# Patient Record
Sex: Female | Born: 1965 | Race: Black or African American | Hispanic: Yes | Smoking: Never smoker
Health system: Southern US, Community
[De-identification: ages and names within clinical notes are randomized; demographics above are authoritative.]

## PROBLEM LIST (undated history)

## (undated) DIAGNOSIS — E669 Obesity, unspecified: Secondary | ICD-10-CM

## (undated) HISTORY — DX: Obesity, unspecified: E66.9

## (undated) HISTORY — PX: TUBAL LIGATION: SHX77

## (undated) HISTORY — PX: LIPOSUCTION: SHX10

## (undated) HISTORY — PX: ABLATION: SHX5711

## (undated) HISTORY — PX: BUNIONECTOMY: SHX129

---

## 2014-03-11 ENCOUNTER — Encounter (HOSPITAL_BASED_OUTPATIENT_CLINIC_OR_DEPARTMENT_OTHER): Payer: Self-pay | Admitting: Emergency Medicine

## 2014-03-11 ENCOUNTER — Emergency Department (HOSPITAL_BASED_OUTPATIENT_CLINIC_OR_DEPARTMENT_OTHER): Payer: BC Managed Care – PPO

## 2014-03-11 ENCOUNTER — Emergency Department (HOSPITAL_BASED_OUTPATIENT_CLINIC_OR_DEPARTMENT_OTHER)
Admission: EM | Admit: 2014-03-11 | Discharge: 2014-03-11 | Disposition: A | Discharge status: Home / Self Care | Payer: BC Managed Care – PPO | Attending: Emergency Medicine | Admitting: Emergency Medicine

## 2014-03-11 DIAGNOSIS — R05 Cough: Secondary | ICD-10-CM

## 2014-03-11 DIAGNOSIS — R059 Cough, unspecified: Secondary | ICD-10-CM

## 2014-03-11 DIAGNOSIS — J9801 Acute bronchospasm: Secondary | ICD-10-CM | POA: Diagnosis not present

## 2014-03-11 MED ORDER — ALBUTEROL SULFATE HFA 108 (90 BASE) MCG/ACT IN AERS
2.0000 | INHALATION_SPRAY | Freq: Once | RESPIRATORY_TRACT | Status: AC
  Administered 2014-03-11: 2 via RESPIRATORY_TRACT
  Filled 2014-03-11: qty 6.7

## 2014-03-11 MED ORDER — BENZONATATE 100 MG PO CAPS: 100.0000 mg | ORAL_CAPSULE | Freq: Three times a day (TID) | ORAL | Status: DC

## 2014-03-11 MED ORDER — ALBUTEROL SULFATE (2.5 MG/3ML) 0.083% IN NEBU
5.0000 mg | INHALATION_SOLUTION | Freq: Once | RESPIRATORY_TRACT | Status: AC
  Administered 2014-03-11: 5 mg via RESPIRATORY_TRACT
  Filled 2014-03-11: qty 6

## 2014-03-11 NOTE — ED Notes (Signed)
C/o prod cough x 1.5 weeks

## 2014-03-11 NOTE — Discharge Instructions (Signed)
Use albuterol inhaler every 4-6 hours as needed for cough and wheezing. Take Tessalon as directed as needed for cough.  Bronchospasm A bronchospasm is a spasm or tightening of the airways going into the lungs. During a bronchospasm breathing becomes more difficult because the airways get smaller. When this happens there can be coughing, a whistling sound when breathing (wheezing), and difficulty breathing. Bronchospasm is often associated with asthma, but not all patients who experience a bronchospasm have asthma. CAUSES  A bronchospasm is caused by inflammation or irritation of the airways. The inflammation or irritation may be triggered by:   Allergies (such as to animals, pollen, food, or mold). Allergens that cause bronchospasm may cause wheezing immediately after exposure or many hours later.   Infection. Viral infections are believed to be the most common cause of bronchospasm.   Exercise.   Irritants (such as pollution, cigarette smoke, strong odors, aerosol sprays, and paint fumes).   Weather changes. Winds increase molds and pollens in the air. Rain refreshes the air by washing irritants out. Cold air may cause inflammation.   Stress and emotional upset.  SIGNS AND SYMPTOMS   Wheezing.   Excessive nighttime coughing.   Frequent or severe coughing with a simple cold.   Chest tightness.   Shortness of breath.  DIAGNOSIS  Bronchospasm is usually diagnosed through a history and physical exam. Tests, such as chest X-rays, are sometimes done to look for other conditions. TREATMENT   Inhaled medicines can be given to open up your airways and help you breathe. The medicines can be given using either an inhaler or a nebulizer machine.  Corticosteroid medicines may be given for severe bronchospasm, usually when it is associated with asthma. HOME CARE INSTRUCTIONS   Always have a plan prepared for seeking medical care. Know when to call your health care provider and  local emergency services (911 in the [place] Know where you can access local emergency care.  Only take medicines as directed by your health care provider.  If you were prescribed an inhaler or nebulizer machine, ask your health care provider to explain how to use it correctly. Always use a spacer with your inhaler if you were given one.  It is necessary to remain calm during an attack. Try to relax and breathe more slowly.  Control your home environment in the following ways:   Change your heating and air conditioning filter at least once a month.   Limit your use of fireplaces and wood stoves.  Do not smoke and do not allow smoking in your home.   Avoid exposure to perfumes and fragrances.   Get rid of pests (such as roaches and mice) and their droppings.   Throw away plants if you see mold on them.   Keep your house clean and dust free.   Replace carpet with wood, tile, or vinyl flooring. Carpet can trap dander and dust.   Use allergy-proof pillows, mattress covers, and box spring covers.   Wash bed sheets and blankets every week in hot water and dry them in a dryer.   Use blankets that are made of polyester or cotton.   Wash hands frequently. SEEK MEDICAL CARE IF:   You have muscle aches.   You have chest pain.   The sputum changes from clear or white to yellow, green, gray, or bloody.   The sputum you cough up gets thicker.   There are problems that may be related to the medicine you are given, such as a  rash, itching, swelling, or trouble breathing.  SEEK IMMEDIATE MEDICAL CARE IF:   You have worsening wheezing and coughing even after taking your prescribed medicines.   You have increased difficulty breathing.   You develop severe chest pain. MAKE SURE YOU:   Understand these instructions.  Will watch your condition.  Will get help right away if you are not doing well or get worse. Document Released: 05/04/2003 Document Revised:  05/06/2013 Document Reviewed: 10/21/2012 [facility] Patient Information 2015 [employer], [place]. This information is not intended to replace advice given to you by your health care provider. Make sure you discuss any questions you have with your health care provider.  Cough, Adult  A cough is a reflex that helps clear your throat and airways. It can help heal the body or may be a reaction to an irritated airway. A cough may only last 2 or 3 weeks (acute) or may last more than 8 weeks (chronic).  CAUSES Acute cough:  Viral or bacterial infections. Chronic cough:  Infections.  Allergies.  Asthma.  Post-nasal drip.  Smoking.  Heartburn or acid reflux.  Some medicines.  Chronic lung problems (COPD).  Cancer. SYMPTOMS   Cough.  Fever.  Chest pain.  Increased breathing rate.  High-pitched whistling sound when breathing (wheezing).  Colored mucus that you cough up (sputum). TREATMENT   A bacterial cough may be treated with antibiotic medicine.  A viral cough must run its course and will not respond to antibiotics.  Your caregiver may recommend other treatments if you have a chronic cough. HOME CARE INSTRUCTIONS   Only take over-the-counter or prescription medicines for pain, discomfort, or fever as directed by your caregiver. Use cough suppressants only as directed by your caregiver.  Use a cold steam vaporizer or humidifier in your bedroom or home to help loosen secretions.  Sleep in a semi-upright position if your cough is worse at night.  Rest as needed.  Stop smoking if you smoke. SEEK IMMEDIATE MEDICAL CARE IF:   You have pus in your sputum.  Your cough starts to worsen.  You cannot control your cough with suppressants and are losing sleep.  You begin coughing up blood.  You have difficulty breathing.  You develop pain which is getting worse or is uncontrolled with medicine.  You have a fever. MAKE SURE YOU:   Understand these instructions.  Will  watch your condition.  Will get help right away if you are not doing well or get worse. Document Released: 10/28/2010 Document Revised: 07/24/2011 Document Reviewed: 10/28/2010 [facility] Patient Information 2015 [place], [place]. This information is not intended to replace advice given to you by your health care provider. Make sure you discuss any questions you have with your health care provider.

## 2014-03-11 NOTE — ED Provider Notes (Signed)
CSN: [id number]     Arrival date & time 03/11/14  1819 History   First MD Initiated Contact with Patient 03/11/14 1826     Chief Complaint  Patient presents with  . Cough     (Consider location/radiation/quality/duration/timing/severity/associated sxs/prior Treatment) HPI Comments: This is a 48 year old female with no significant past medical history who presents to the emergency department complaining of a productive cough with yellow mucus 1-1/2 weeks. Patient endorses occasional shortness of breath with the cough. Denies chest pain, fever or chills. States she has been congested. She has been taking over-the-counter cough medication, Mucinex decongestant and NyQuil with minimal relief.  Patient is a 48 y.o. female presenting with cough. The history is provided by the patient.  Cough   History reviewed. No pertinent past medical history. Past Surgical History  Procedure Laterality Date  . Tubal ligation    . Cesarean section    . Bunionectomy     No family history on file. History  Substance Use Topics  . Smoking status: Never Smoker   . Smokeless tobacco: Not on file  . Alcohol Use: Yes   OB History   Grav Para Term Preterm Abortions TAB SAB Ect Mult Living                 Review of Systems  Respiratory: Positive for cough.    10 Systems reviewed and are negative for acute change except as noted in the HPI.   Allergies  Shellfish allergy  Home Medications   Prior to Admission medications   Medication Sig Start Date End Date Taking? Authorizing Provider  benzonatate (TESSALON) 100 MG capsule Take 1 capsule (100 mg total) by mouth every 8 (eight) hours. 03/11/14   Apolonio Cutting M Delainy Mcelhiney, PA-C   BP 151/91  Pulse 84  Temp(Src) 98.2 F (36.8 C) (Oral)  Resp 16  Ht 5\' 3"  (1.6 m)  Wt 220 lb (99.791 kg)  BMI 38.98 kg/m2  SpO2 99%  LMP 02/16/2014 Physical Exam  Nursing note and vitals reviewed. Constitutional: She is oriented to person, place, and time. She appears  well-developed and well-nourished. No distress.  HENT:  Head: Normocephalic and atraumatic.  Nasal congestion, mucosal edema. Post nasal drip.  Eyes: Conjunctivae and EOM are normal.  Neck: Normal range of motion. Neck supple.  Cardiovascular: Normal rate, regular rhythm and normal heart sounds.   Pulmonary/Chest: Effort normal and breath sounds normal.  Few scattered expiratory wheezes.  Musculoskeletal: Normal range of motion. She exhibits no edema.  Neurological: She is alert and oriented to person, place, and time. No sensory deficit.  Skin: Skin is warm and dry.  Psychiatric: She has a normal mood and affect. Her behavior is normal.    ED Course  Procedures (including critical care time) Labs Review Labs Reviewed - No data to display  Imaging Review Dg Chest 2 View  03/11/2014   CLINICAL DATA:  Shortness of breath and cough.  EXAM: CHEST  2 VIEW  COMPARISON:  None.  FINDINGS: The heart size and mediastinal contours are within normal limits. Both lungs are clear. The visualized skeletal structures are unremarkable.  IMPRESSION: No active cardiopulmonary disease.   Electronically Signed   By: Signa Kellaylor  Stroud M.D.   On: 03/11/2014 18:51     EKG Interpretation None      MDM   Final diagnoses:  Cough  Bronchospasm   Patient was unable to cough for a week and a half. She is nontoxic appearing and in no apparent distress. Afebrile,  vital signs stable. O2 sat 99% on room air. Chest x-ray obtained given productive cough x 1.5 weeks, no acute findings. Scattered expiratory wheezes on exam. After receiving nebulizer treatment, significant improvement of breath sounds. Patient reports she is feeling better. Will discharge patient home with albuterol inhaler and Tessalon for cough. Resources given for PCP follow-up. Stable for discharge. Return precautions given. Patient states understanding of treatment care plan and is agreeable.   Kathrynn SpeedRobyn M Sendy Pluta, PA-C 03/11/14 1915  Kathrynn Speedobyn M Tinisha Etzkorn,  PA-C 03/11/14 1919

## 2014-03-12 NOTE — ED Provider Notes (Signed)
Medical screening examination/treatment/procedure(s) were performed by non-physician practitioner and as supervising physician I was immediately available for consultation/collaboration.   EKG Interpretation None        Trey Esteban Kobashigawa, MD 03/12/14 0016 

## 2015-07-05 ENCOUNTER — Encounter (HOSPITAL_BASED_OUTPATIENT_CLINIC_OR_DEPARTMENT_OTHER): Payer: Self-pay

## 2015-07-05 ENCOUNTER — Emergency Department (HOSPITAL_BASED_OUTPATIENT_CLINIC_OR_DEPARTMENT_OTHER)
Admission: EM | Admit: 2015-07-05 | Discharge: 2015-07-05 | Disposition: A | Discharge status: Home / Self Care | Payer: BLUE CROSS/BLUE SHIELD | Attending: Emergency Medicine | Admitting: Emergency Medicine

## 2015-07-05 DIAGNOSIS — R51 Headache: Secondary | ICD-10-CM | POA: Diagnosis present

## 2015-07-05 DIAGNOSIS — J111 Influenza due to unidentified influenza virus with other respiratory manifestations: Secondary | ICD-10-CM | POA: Diagnosis not present

## 2015-07-05 DIAGNOSIS — R634 Abnormal weight loss: Secondary | ICD-10-CM | POA: Insufficient documentation

## 2015-07-05 DIAGNOSIS — R69 Illness, unspecified: Secondary | ICD-10-CM

## 2015-07-05 MED ORDER — ACETAMINOPHEN 500 MG PO TABS: 1000.0000 mg | ORAL_TABLET | Freq: Four times a day (QID) | ORAL | Status: DC | PRN

## 2015-07-05 MED ORDER — KETOROLAC TROMETHAMINE 60 MG/2ML IM SOLN
60.0000 mg | Freq: Once | INTRAMUSCULAR | Status: AC
  Administered 2015-07-05: 60 mg via INTRAMUSCULAR
  Filled 2015-07-05: qty 2

## 2015-07-05 MED ORDER — ACETAMINOPHEN 500 MG PO TABS
1000.0000 mg | ORAL_TABLET | Freq: Once | ORAL | Status: AC
  Administered 2015-07-05: 1000 mg via ORAL
  Filled 2015-07-05: qty 2

## 2015-07-05 MED ORDER — IBUPROFEN 800 MG PO TABS: 800.0000 mg | ORAL_TABLET | Freq: Three times a day (TID) | ORAL | Status: DC

## 2015-07-05 MED FILL — IBUPROFEN 800 MG TABLET: 800 | 7 days supply | Qty: 21 | Fill #0

## 2015-07-05 MED FILL — MAPAP 500 MG CAPLET: 500 | 12 days supply | Qty: 100 | Fill #0

## 2015-07-05 NOTE — ED Provider Notes (Signed)
CSN: [id number]     Arrival date & time 07/05/15  1058 History   First MD Initiated Contact with Patient 07/05/15 1312     Chief Complaint  Patient presents with  . Headache     (Consider location/radiation/quality/duration/timing/severity/associated sxs/prior Treatment) HPI Patient reports that she was seen by her weight loss clinic Toma Copier) on Saturday. She was started on Topamax as part of her weight loss regimen which is supposed to be synergistic with phentermine. The Topamax was not started for headaches or other purpose. She denies history of migraines. The patient poor she took her first dose on Sunday. She reports right after she took it she started to feel badly. She started getting some scratchy throat and cough. He checked the symptoms side effect profile and noted that cold symptoms were a listed side effect. She reports she went on to get body aches and headache. She reports her headache is generalized and she hurts in her eye sockets. She denies photophobia. No vomiting. She has had some cough but no chest pain or shortness of breath. She reports low-grade fever was just identified in the emergency department. History reviewed. No pertinent past medical history. Past Surgical History  Procedure Laterality Date  . Tubal ligation    . Cesarean section    . Bunionectomy     No family history on file. Social History  Substance Use Topics  . Smoking status: Never Smoker   . Smokeless tobacco: None  . Alcohol Use: No   OB History    No data available     Review of Systems 10 Systems reviewed and are negative for acute change except as noted in the HPI.    Allergies  Shellfish allergy  Home Medications   Prior to Admission medications   Medication Sig Start Date End Date Taking? Authorizing Provider  PHENTERMINE HCL PO Take by mouth.   Yes Historical Provider, MD  acetaminophen (TYLENOL) 500 MG tablet Take 2 tablets (1,000 mg total) by mouth every 6 (six) hours as  needed. 07/05/15   Arby Barrette, MD  ibuprofen (ADVIL,MOTRIN) 800 MG tablet Take 1 tablet (800 mg total) by mouth 3 (three) times daily. 07/05/15   Arby Barrette, MD   BP 117/85 mmHg  Pulse 108  Temp(Src) 100.2 F (37.9 C) (Oral)  Resp 16  Ht  (1.6 m)  Wt 184 lb (83.462 kg)  BMI 32.60 kg/m2  SpO2 98%  LMP  (LMP Unknown) Physical Exam  Constitutional: She is oriented to person, place, and time. She appears well-developed and well-nourished.  Patient appears uncomfortable but is alert and appropriate. She is nontoxic. No respiratory distress. Normal mental status.  HENT:  Head: Normocephalic and atraumatic.  Right Ear: External ear normal.  Left Ear: External ear normal.  Nose: Nose normal.  Mouth/Throat: Oropharynx is clear and moist.  Bilateral TMs no erythema or bulging.  Eyes: EOM are normal. Pupils are equal, round, and reactive to light.  Neck: Neck supple.  No meningismus.  Cardiovascular: Normal rate, regular rhythm, normal heart sounds and intact distal pulses.   Pulmonary/Chest: Effort normal and breath sounds normal.  Abdominal: Soft. Bowel sounds are normal. She exhibits no distension. There is no tenderness.  Musculoskeletal: Normal range of motion. She exhibits no edema.  Neurological: She is alert and oriented to person, place, and time. She has normal strength. No cranial nerve deficit. She exhibits normal muscle tone. Coordination normal. GCS eye subscore is 4. GCS verbal subscore is 5. GCS motor subscore  is 6.  Skin: Skin is warm, dry and intact.  Psychiatric: She has a normal mood and affect.    ED Course  Procedures (including critical care time) Labs Review Labs Reviewed - No data to display  Imaging Review No results found. I have personally reviewed and evaluated these images and lab results as part of my medical decision-making.   EKG Interpretation None      MDM   Final diagnoses:  Influenza-like illness   Patient feels strongly that  her symptoms are the side effect of Topamax. At this point, I have high suspicion of influenza as the etiology of symptoms. Patient was counseled that I felt this was the most likely diagnosis. She is counseled on the use of ibuprofen and Tylenol for headache and body aches with fever. Patient is alert and nontoxic. She has no respiratory distress. At this time feel she is safe for outpatient treatment for influenza-like illness.    Arby Barrette, MD 07/05/15 1345

## 2015-07-05 NOTE — ED Notes (Signed)
C/o HA, burning to eyes, "feel like i have a cold but i don't"-"scratchy throat"- s/s started sunday after taking first does of new med topamax-t NAD-steady gait

## 2015-07-05 NOTE — Discharge Instructions (Signed)
Suspected Influenza, Adult °Influenza ("the flu") is a viral infection of the respiratory tract. It occurs more often in winter months because people spend more time in close contact with one another. Influenza can make you feel very sick. Influenza easily spreads from person to person (contagious). °CAUSES  °Influenza is caused by a virus that infects the respiratory tract. You can catch the virus by breathing in droplets from an infected person's cough or sneeze. You can also catch the virus by touching something that was recently contaminated with the virus and then touching your mouth, nose, or eyes. °RISKS AND COMPLICATIONS °You may be at risk for a more severe case of influenza if you smoke cigarettes, have diabetes, have chronic heart disease (such as heart failure) or lung disease (such as asthma), or if you have a weakened immune system. Elderly people and pregnant women are also at risk for more serious infections. The most common problem of influenza is a lung infection (pneumonia). Sometimes, this problem can require emergency medical care and may be life threatening. °SIGNS AND SYMPTOMS  °Symptoms typically last 4 to 10 days and may include: °· Fever. °· Chills. °· Headache, body aches, and muscle aches. °· Sore throat. °· Chest discomfort and cough. °· Poor appetite. °· Weakness or feeling tired. °· Dizziness. °· Nausea or vomiting. °DIAGNOSIS  °Diagnosis of influenza is often made based on your history and a physical exam. A nose or throat swab test can be done to confirm the diagnosis. °TREATMENT  °In mild cases, influenza goes away on its own. Treatment is directed at relieving symptoms. For more severe cases, your health care provider may prescribe antiviral medicines to shorten the sickness. Antibiotic medicines are not effective because the infection is caused by a virus, not by bacteria. °HOME CARE INSTRUCTIONS °· Take medicines only as directed by your health care provider. °· Use a cool mist  humidifier to make breathing easier. °· Get plenty of rest until your temperature returns to normal. This usually takes 3 to 4 days. °· Drink enough fluid to keep your urine clear or pale yellow. °· Cover your mouth and nose when coughing or sneezing, and wash your hands well to prevent the virus from spreading. °· Stay home from work or school until the fever is gone for at least 1 full day. °PREVENTION  °An annual influenza vaccination (flu shot) is the best way to avoid getting influenza. An annual flu shot is now routinely recommended for all adults in the [place] °SEEK MEDICAL CARE IF: °· You experience chest pain, your cough worsens, or you produce more mucus. °· You have nausea, vomiting, or diarrhea. °· Your fever returns or gets worse. °SEEK IMMEDIATE MEDICAL CARE IF: °· You have trouble breathing, you become short of breath, or your skin or nails become bluish. °· You have severe pain or stiffness in the neck. °· You develop a sudden headache, or pain in the face or ear. °· You have nausea or vomiting that you cannot control. °MAKE SURE YOU:  °· Understand these instructions. °· Will watch your condition. °· Will get help right away if you are not doing well or get worse. °  °This information is not intended to replace advice given to you by your health care provider. Make sure you discuss any questions you have with your health care provider. °  °Document Released: 04/28/2000 Document Revised: 05/22/2014 Document Reviewed: 07/31/2011 °Elsevier Interactive Patient Education ©2016 [employer] Inc. ° °

## 2021-02-06 ENCOUNTER — Encounter (HOSPITAL_BASED_OUTPATIENT_CLINIC_OR_DEPARTMENT_OTHER): Payer: Self-pay | Admitting: Emergency Medicine

## 2021-02-06 ENCOUNTER — Emergency Department (HOSPITAL_BASED_OUTPATIENT_CLINIC_OR_DEPARTMENT_OTHER)
Admission: EM | Admit: 2021-02-06 | Discharge: 2021-02-06 | Disposition: A | Discharge status: Home / Self Care | Payer: Commercial Managed Care - PPO | Attending: Emergency Medicine | Admitting: Emergency Medicine

## 2021-02-06 ENCOUNTER — Other Ambulatory Visit: Payer: Self-pay

## 2021-02-06 DIAGNOSIS — H00012 Hordeolum externum right lower eyelid: Secondary | ICD-10-CM

## 2021-02-06 DIAGNOSIS — H02842 Edema of right lower eyelid: Secondary | ICD-10-CM | POA: Diagnosis present

## 2021-02-06 DIAGNOSIS — Z91040 Latex allergy status: Secondary | ICD-10-CM | POA: Diagnosis not present

## 2021-02-06 NOTE — ED Notes (Signed)
ED Provider at bedside. 

## 2021-02-06 NOTE — Discharge Instructions (Addendum)
If your stye does not improve in 1-2 weeks, call the number above for evaluation by ophthalmology.

## 2021-02-06 NOTE — ED Triage Notes (Addendum)
States has been having swelling and soreness to right lower eyelid , swelling noted to tear duct area of  OD, denies visual changes

## 2021-02-06 NOTE — ED Provider Notes (Signed)
[facility] EMERGENCY DEPARTMENT Provider Note   CSN: [id number] Arrival date & time: 02/06/21  0820     History Chief Complaint  Patient presents with   Eye Problem    Rita Hall is a 55 y.o. female.  HPI     55yo female presents with concern for swelling and soreness to right lower eyelid.  Began days ago.  NO visual changes, no congestion, no fever.  Had some discharge from eye today. No hx of similar.   History reviewed. No pertinent past medical history.  There are no problems to display for this patient.   Past Surgical History:  Procedure Laterality Date   ABLATION N/A    Uterine   BUNIONECTOMY     CESAREAN SECTION     LIPOSUCTION N/A    TUBAL LIGATION       OB History   No obstetric history on file.     No family history on file.  Social History   Tobacco Use   Smoking status: Never   Smokeless tobacco: Never  Substance Use Topics   Alcohol use: No   Drug use: No    Home Medications Prior to Admission medications   Medication Sig Start Date End Date Taking? Authorizing Provider  acetaminophen (TYLENOL) 500 MG tablet Take 2 tablets (1,000 mg total) by mouth every 6 (six) hours as needed. 07/05/15   Arby Barrette, MD  ibuprofen (ADVIL,MOTRIN) 800 MG tablet Take 1 tablet (800 mg total) by mouth 3 (three) times daily. 07/05/15   Arby Barrette, MD  PHENTERMINE HCL PO Take by mouth.    [provider]    Allergies    Latex and Shellfish allergy  Review of Systems   Review of Systems  Constitutional:  Negative for fever.  Eyes:  Positive for pain. Negative for photophobia, discharge, redness, itching and visual disturbance.  Respiratory:  Negative for cough and shortness of breath.   Cardiovascular:  Negative for chest pain.  Gastrointestinal:  Negative for abdominal pain, nausea and vomiting.  Genitourinary:  Negative for difficulty urinating.  Skin:  Positive for rash.  Neurological:  Negative for syncope and  headaches.   Physical Exam Updated Vital Signs BP (!) 170/97 (BP Location: Right Arm)   Pulse (!) 59   Temp 98.2 F (36.8 C) (Oral)   Resp 16   Ht 5\' 4"  (1.626 m)   Wt 83.9 kg   SpO2 100%   BMI 31.76 kg/m   Physical Exam Vitals and nursing note reviewed.  Constitutional:      General: She is not in acute distress.    Appearance: Normal appearance. She is not ill-appearing, toxic-appearing or diaphoretic.  HENT:     Head: Normocephalic.     Comments: Hordeolum/right lower eyelid with swelling Eyes:     Conjunctiva/sclera: Conjunctivae normal.  Cardiovascular:     Rate and Rhythm: Normal rate and regular rhythm.     Pulses: Normal pulses.  Pulmonary:     Effort: Pulmonary effort is normal. No respiratory distress.  Musculoskeletal:        General: No deformity or signs of injury.     Cervical back: No rigidity.  Skin:    General: Skin is warm and dry.     Coloration: Skin is not jaundiced or pale.  Neurological:     General: No focal deficit present.     Mental Status: She is alert and oriented to person, place, and time.    ED Results / Procedures /  Treatments   Labs (all labs ordered are listed, but only abnormal results are displayed) Labs Reviewed - No data to display  EKG None  Radiology No results found.  Procedures Procedures   Medications Ordered in ED Medications - No data to display  ED Course  I have reviewed the triage vital signs and the nursing notes.  Pertinent labs & imaging results that were available during my care of the patient were reviewed by me and considered in my medical decision making (see chart for details).    MDM Rules/Calculators/A&P                            55yo female presents with concern for swelling and soreness to right lower eyelid.  Not consistent with dacrocystitis, large abscess, orbital cellulitis, periorbital cellulitis or conjunctivitis.  Consistent with hordeolum. Recommend warm compresses, follow up 1-2  weeks with ophthalmology if now improvement, return for worsening.    Final Clinical Impression(s) / ED Diagnoses Final diagnoses:  Hordeolum externum of right lower eyelid    Rx / DC Orders ED Discharge Orders     None        Alvira Monday, MD 02/06/21 2326

## 2021-03-24 ENCOUNTER — Ambulatory Visit (INDEPENDENT_AMBULATORY_CARE_PROVIDER_SITE_OTHER): Payer: Commercial Managed Care - PPO | Admitting: Family Medicine

## 2021-03-24 ENCOUNTER — Encounter: Payer: Self-pay | Admitting: Family Medicine

## 2021-03-24 ENCOUNTER — Other Ambulatory Visit (HOSPITAL_COMMUNITY)
Admission: RE | Admit: 2021-03-24 | Discharge: 2021-03-24 | Disposition: A | Discharge status: Home / Self Care | Payer: Commercial Managed Care - PPO | Source: Ambulatory Visit | Attending: Family Medicine | Admitting: Family Medicine

## 2021-03-24 ENCOUNTER — Other Ambulatory Visit: Payer: Self-pay

## 2021-03-24 VITALS — BP 132/79 | HR 73 | Wt 188.0 lb

## 2021-03-24 DIAGNOSIS — N951 Menopausal and female climacteric states: Secondary | ICD-10-CM

## 2021-03-24 DIAGNOSIS — Z113 Encounter for screening for infections with a predominantly sexual mode of transmission: Secondary | ICD-10-CM | POA: Diagnosis not present

## 2021-03-24 DIAGNOSIS — Z01419 Encounter for gynecological examination (general) (routine) without abnormal findings: Secondary | ICD-10-CM | POA: Diagnosis not present

## 2021-03-24 NOTE — Progress Notes (Signed)
GYNECOLOGY ANNUAL PREVENTATIVE CARE ENCOUNTER NOTE  Subjective:   Rita Hall is a 55 y.o. G80P2002 female here for a routine annual gynecologic exam.  Current complaints: occasional hot flashes. Had ablation approximately 7 years ago at [facility]. Had bleeding and discharge for about a year, then it stopped. No bleeding since then.   Denies abnormal vaginal bleeding, discharge, pelvic pain, problems with intercourse or other gynecologic concerns.    Gynecologic History No LMP recorded (lmp unknown). Patient is postmenopausal.   Contraception: tubal ligation Last Pap: 2006. Results were: normal Last mammogram: n/a.  Colorectal Cancer Screening: none - has appt with PCP  Obstetric History OB History  Gravida Para Term Preterm AB Living  2 2 2     2   SAB IAB Ectopic Multiple Live Births          2    # Outcome Date GA Lbr Len/2nd Weight Sex Delivery Anes PTL Lv  2 Term 1996 [redacted]w[redacted]d   F CS-LTranv EPI N LIV  1 Term 31 [redacted]w[redacted]d   M Vag-Spont None N LIV    History reviewed. No pertinent past medical history.  Past Surgical History:  Procedure Laterality Date   ABLATION N/A    Uterine   BUNIONECTOMY     CESAREAN SECTION     LIPOSUCTION N/A    TUBAL LIGATION      Current Outpatient Medications on File Prior to Visit  Medication Sig Dispense Refill   phentermine 37.5 MG capsule Take 37.5 mg by mouth every morning.     acetaminophen (TYLENOL) 500 MG tablet Take 2 tablets (1,000 mg total) by mouth every 6 (six) hours as needed. (Patient not taking: Reported on 03/24/2021) 30 tablet 0   ibuprofen (ADVIL,MOTRIN) 800 MG tablet Take 1 tablet (800 mg total) by mouth 3 (three) times daily. (Patient not taking: Reported on 03/24/2021) 21 tablet 0   PHENTERMINE HCL PO Take by mouth. (Patient not taking: Reported on 03/24/2021)     No current facility-administered medications on file prior to visit.    Allergies  Allergen Reactions   Latex Dermatitis   Shellfish Allergy     Social  History   Socioeconomic History   Marital status: Divorced    Spouse name: Not on file   Number of children: Not on file   Years of education: Not on file   Highest education level: Not on file  Occupational History   Not on file  Tobacco Use   Smoking status: Never   Smokeless tobacco: Never  Substance and Sexual Activity   Alcohol use: No   Drug use: No   Sexual activity: Not Currently    Birth control/protection: Surgical  Other Topics Concern   Not on file  Social History Narrative   Not on file   Social Determinants of Health   Financial Resource Strain: Not on file  Food Insecurity: Not on file  Transportation Needs: Not on file  Physical Activity: Not on file  Stress: Not on file  Social Connections: Not on file  Intimate Partner Violence: Not on file    Family History  Problem Relation Age of Onset   Stroke Father    Hypertension Mother    Diabetes Mother    High Cholesterol Mother     The following portions of the patient's history were reviewed and updated as appropriate: allergies, current medications, past family history, past medical history, past social history, past surgical history and problem list.  Review of Systems Pertinent  items are noted in HPI.   Objective:  BP 132/79   Pulse 73   Wt 188 lb (85.3 kg)   LMP  (LMP Unknown)   BMI 32.27 kg/m  Wt Readings from Last 3 Encounters:  03/24/21 188 lb (85.3 kg)  02/06/21 185 lb (83.9 kg)  07/05/15 184 lb (83.5 kg)     Chaperone present during exam  CONSTITUTIONAL: Well-developed, well-nourished female in no acute distress.  HENT:  Normocephalic, atraumatic, External right and left ear normal. Oropharynx is clear and moist EYES: Conjunctivae and EOM are normal. Pupils are equal, round, and reactive to light. No scleral icterus.  NECK: Normal range of motion, supple, no masses.  Normal thyroid.   CARDIOVASCULAR: Normal heart rate noted, regular rhythm RESPIRATORY: Clear to auscultation  bilaterally. Effort and breath sounds normal, no problems with respiration noted. BREASTS: Symmetric in size. No masses, skin changes, nipple drainage, or lymphadenopathy. ABDOMEN: Soft, normal bowel sounds, no distention noted.  No tenderness, rebound or guarding.  PELVIC: Normal appearing external genitalia; normal appearing vaginal mucosa and cervix.  No abnormal discharge noted.  MUSCULOSKELETAL: Normal range of motion. No tenderness.  No cyanosis, clubbing, or edema.  2+ distal pulses. SKIN: Skin is warm and dry. No rash noted. Not diaphoretic. No erythema. No pallor. NEUROLOGIC: Alert and oriented to person, place, and time. Normal reflexes, muscle tone coordination. No cranial nerve deficit noted. PSYCHIATRIC: Normal mood and affect. Normal behavior. Normal judgment and thought content.  Assessment:  Annual gynecologic examination with pap smear   Plan:  1. Well Woman Exam Will follow up results of pap smear and manage accordingly. Mammogram scheduled STD testing discussed. Patient requested testing - Cytology - PAP( [place]) - MM DIGITAL SCREENING BILATERAL; Future - HIV antibody (with reflex) - Hepatitis C Antibody - Hepatitis B Surface AntiGEN - RPR  2. Routine screening for STI (sexually transmitted infection) - Cytology - PAP( [place]) - MM DIGITAL SCREENING BILATERAL; Future - HIV antibody (with reflex) - Hepatitis C Antibody - Hepatitis B Surface AntiGEN - RPR  3. Perimenopause Will check FSH and estrogen. Likely menopausal. - Follicle stimulating hormone - Estrogens, Total   Routine preventative health maintenance measures emphasized. Please refer to After Visit Summary for other counseling recommendations.    Candelaria Celeste, DO [facility]

## 2021-03-28 LAB — FOLLICLE STIMULATING HORMONE: FSH: 65.2 m[IU]/mL

## 2021-03-28 LAB — HEPATITIS C ANTIBODY: Hep C Virus Ab: <0.1 s/co ratio (ref 0.0–0.9)

## 2021-03-28 LAB — HEPATITIS B SURFACE ANTIGEN: Hepatitis B Surface Ag: NEGATIVE

## 2021-03-28 LAB — HIV ANTIBODY (ROUTINE TESTING W REFLEX): HIV Screen 4th Generation wRfx: NONREACTIVE

## 2021-03-28 LAB — ESTROGENS, TOTAL: Estrogen: 69 pg/mL

## 2021-03-28 LAB — RPR: RPR Ser Ql: NONREACTIVE

## 2021-03-29 LAB — CYTOLOGY - PAP
Chlamydia: NEGATIVE
Comment: NEGATIVE
Comment: NEGATIVE
Comment: NORMAL
Diagnosis: NEGATIVE
High risk HPV: POSITIVE — AB
Neisseria Gonorrhea: NEGATIVE

## 2021-03-30 ENCOUNTER — Encounter: Payer: Self-pay | Admitting: Family Medicine

## 2021-03-30 DIAGNOSIS — R87810 Cervical high risk human papillomavirus (HPV) DNA test positive: Secondary | ICD-10-CM | POA: Insufficient documentation

## 2021-04-11 ENCOUNTER — Encounter: Payer: Self-pay | Admitting: Medical

## 2021-04-11 ENCOUNTER — Other Ambulatory Visit: Payer: Self-pay

## 2021-04-11 ENCOUNTER — Ambulatory Visit (INDEPENDENT_AMBULATORY_CARE_PROVIDER_SITE_OTHER): Payer: Commercial Managed Care - PPO | Admitting: Medical

## 2021-04-11 VITALS — BP 138/80 | HR 81 | Temp 98.0°F | Resp 18 | Ht 64.0 in | Wt 189.6 lb

## 2021-04-11 DIAGNOSIS — E669 Obesity, unspecified: Secondary | ICD-10-CM

## 2021-04-11 DIAGNOSIS — Z0000 Encounter for general adult medical examination without abnormal findings: Secondary | ICD-10-CM | POA: Diagnosis not present

## 2021-04-11 LAB — COMPREHENSIVE METABOLIC PANEL
ALT: 14 U/L (ref 0–35)
AST: 17 U/L (ref 0–37)
Albumin: 3.9 g/dL (ref 3.5–5.2)
Alkaline Phosphatase: 96 U/L (ref 39–117)
BUN: 14 mg/dL (ref 6–23)
CO2: 30 mEq/L (ref 19–32)
Calcium: 9.2 mg/dL (ref 8.4–10.5)
Chloride: 104 mEq/L (ref 96–112)
Creatinine, Ser: 0.9 mg/dL (ref 0.40–1.20)
GFR: 72.28 mL/min (ref >60.00)
Glucose, Bld: 101 mg/dL — ABNORMAL HIGH (ref 70–99)
Potassium: 4 mEq/L (ref 3.5–5.1)
Sodium: 141 mEq/L (ref 135–145)
Total Bilirubin: 0.5 mg/dL (ref 0.2–1.2)
Total Protein: 7 g/dL (ref 6.0–8.3)

## 2021-04-11 LAB — CBC WITH DIFFERENTIAL/PLATELET
Basophils Absolute: 0 10*3/uL (ref 0.0–0.1)
Basophils Relative: 0.7 % (ref 0.0–3.0)
Eosinophils Absolute: 0.3 10*3/uL (ref 0.0–0.7)
Eosinophils Relative: 6.1 % — ABNORMAL HIGH (ref 0.0–5.0)
HCT: 35.7 % — ABNORMAL LOW (ref 36.0–46.0)
Hemoglobin: 11.6 g/dL — ABNORMAL LOW (ref 12.0–15.0)
Lymphocytes Relative: 54.2 % — ABNORMAL HIGH (ref 12.0–46.0)
Lymphs Abs: 2.2 10*3/uL (ref 0.7–4.0)
MCHC: 32.5 g/dL (ref 30.0–36.0)
MCV: 84.5 fl (ref 78.0–100.0)
Monocytes Absolute: 0.3 10*3/uL (ref 0.1–1.0)
Monocytes Relative: 7.4 % (ref 3.0–12.0)
Neutro Abs: 1.3 10*3/uL — ABNORMAL LOW (ref 1.4–7.7)
Neutrophils Relative %: 31.6 % — ABNORMAL LOW (ref 43.0–77.0)
Platelets: 252 10*3/uL (ref 150.0–400.0)
RBC: 4.22 Mil/uL (ref 3.87–5.11)
RDW: 13.6 % (ref 11.5–15.5)
WBC: 4.1 10*3/uL (ref 4.0–10.5)

## 2021-04-11 LAB — LIPID PANEL
Cholesterol: 192 mg/dL (ref 0–200)
HDL: 61.9 mg/dL (ref >39.00)
LDL Cholesterol: 118 mg/dL — ABNORMAL HIGH (ref 0–99)
NonHDL: 129.88
Total CHOL/HDL Ratio: 3
Triglycerides: 61 mg/dL (ref 0.0–149.0)
VLDL: 12.2 mg/dL (ref 0.0–40.0)

## 2021-04-11 NOTE — Progress Notes (Signed)
Subjective:    Patient ID: Rita Hall, female    DOB: 07/22/1965, 55 y.o.   MRN: [id number]  HPI  Pt in for first time.   Pt in January will be changing job. 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 Pt does exercise daily in am. Nonsmoker. Alcohol glass of wine once a month. Occasional coffee. Pt thinks eating healthy. But does skip breakfast.  Decided to go ahead and do wellness exam today. Pt is fasting.   She is scheduled to get her updated mammogram in 2023.   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 Her gyn had advsied to repeat pap in one year.  Pt is obese and she is currently on phentermine for weight loss. Pt state wt loss clinic on [address].  Pt states had colonoscopy when 55 yo. States told to repeat in 10 years.   Past Medical History:  Diagnosis Date  . Obese      Social History   Socioeconomic History  . Marital status: Divorced    Spouse name: Not on file  . Number of children: Not on file  . Years of education: Not on file  . Highest education level: Not on file  Occupational History  . Not on file  Tobacco Use  . Smoking status: Never  . Smokeless tobacco: Never  Vaping Use  . Vaping Use: Never used  Substance and Sexual Activity  . Alcohol use: Yes    Alcohol/week: 1.0 standard drink    Types: 1 Glasses of wine per week    Comment: very rare.  . Drug use: No  . Sexual activity: Not Currently    Birth control/protection: Surgical  Other Topics Concern  . Not on file  Social History Narrative  . Not on file   Social Determinants of Health   Financial Resource Strain: Not on file  Food Insecurity: Not on file  Transportation Needs: Not on file  Physical Activity: Not on file  Stress: Not on file  Social Connections: Not on file  Intimate Partner Violence: Not on file    Past Surgical History:  Procedure Laterality Date  . ABLATION N/A    Uterine  .  BUNIONECTOMY    . CESAREAN SECTION    . LIPOSUCTION N/A   . TUBAL LIGATION      Family History  Problem Relation Age of Onset  . Stroke Father   . Hypertension Mother   . Diabetes Mother   . High Cholesterol Mother     Allergies  Allergen Reactions  . Latex Dermatitis  . Shellfish Allergy     Current Outpatient Medications on File Prior to Visit  Medication Sig Dispense Refill  . acetaminophen (TYLENOL) 500 MG tablet Take 2 tablets (1,000 mg total) by mouth every 6 (six) hours as needed. 30 tablet 0  . ibuprofen (ADVIL,MOTRIN) 800 MG tablet Take 1 tablet (800 mg total) by mouth 3 (three) times daily. 21 tablet 0  . phentermine 37.5 MG capsule Take 37.5 mg by mouth every morning.    Marland Kitchen PHENTERMINE HCL PO Take by mouth.     No current facility-administered medications on file prior to visit.    BP 138/80   Pulse 81   Temp 98 F (36.7 C)   Resp 18   Ht 5\' 4"  (1.626 m)   Wt 189 lb 9.6 oz (86 kg)   LMP  (LMP Unknown)   SpO2 99%  BMI 32.54 kg/m      Review of Systems  Constitutional:  Negative for chills, fatigue and fever.  HENT:  Negative for congestion, drooling and ear discharge.   Respiratory:  Negative for cough, chest tightness, shortness of breath and wheezing.   Cardiovascular:  Negative for chest pain and palpitations.  Gastrointestinal:  Negative for abdominal pain.  Genitourinary:  Negative for difficulty urinating, dysuria, flank pain, frequency, genital sores, vaginal discharge and vaginal pain.  Musculoskeletal:  Negative for back pain, joint swelling and myalgias.  Skin:  Negative for rash.  Neurological:  Negative for dizziness, speech difficulty, weakness, numbness and headaches.  Hematological:  Negative for adenopathy. Does not bruise/bleed easily.  Psychiatric/Behavioral:  Negative for behavioral problems and decreased concentration.    No past medical history on file.   Social History   Socioeconomic History  . Marital status: Divorced     Spouse name: Not on file  . Number of children: Not on file  . Years of education: Not on file  . Highest education level: Not on file  Occupational History  . Not on file  Tobacco Use  . Smoking status: Never  . Smokeless tobacco: Never  Substance and Sexual Activity  . Alcohol use: No  . Drug use: No  . Sexual activity: Not Currently    Birth control/protection: Surgical  Other Topics Concern  . Not on file  Social History Narrative  . Not on file   Social Determinants of Health   Financial Resource Strain: Not on file  Food Insecurity: Not on file  Transportation Needs: Not on file  Physical Activity: Not on file  Stress: Not on file  Social Connections: Not on file  Intimate Partner Violence: Not on file    Past Surgical History:  Procedure Laterality Date  . ABLATION N/A    Uterine  . BUNIONECTOMY    . CESAREAN SECTION    . LIPOSUCTION N/A   . TUBAL LIGATION      Family History  Problem Relation Age of Onset  . Stroke Father   . Hypertension Mother   . Diabetes Mother   . High Cholesterol Mother     Allergies  Allergen Reactions  . Latex Dermatitis  . Shellfish Allergy     Current Outpatient Medications on File Prior to Visit  Medication Sig Dispense Refill  . acetaminophen (TYLENOL) 500 MG tablet Take 2 tablets (1,000 mg total) by mouth every 6 (six) hours as needed. 30 tablet 0  . ibuprofen (ADVIL,MOTRIN) 800 MG tablet Take 1 tablet (800 mg total) by mouth 3 (three) times daily. 21 tablet 0  . phentermine 37.5 MG capsule Take 37.5 mg by mouth every morning.    Marland Kitchen PHENTERMINE HCL PO Take by mouth.     No current facility-administered medications on file prior to visit.    BP 138/80   Pulse 81   Temp 98 F (36.7 C)   Resp 18   Ht 5\' 4"  (1.626 m)   Wt 189 lb 9.6 oz (86 kg)   LMP  (LMP Unknown)   SpO2 99%   BMI 32.54 kg/m        Objective:   Physical Exam  General Mental Status- Alert. General Appearance- Not in acute distress.    Skin General: Color- Normal Color. Moisture- Normal Moisture.  Neck Carotid Arteries- Normal color. Moisture- Normal Moisture. No carotid bruits. No JVD.  Chest and Lung Exam Auscultation: Breath Sounds:-Normal.  Cardiovascular Auscultation:Rythm- Regular. Murmurs & Other Heart  Sounds:Auscultation of the heart reveals- No Murmurs.  Abdomen Inspection:-Inspeection Normal. Palpation/Percussion:Note:No mass. Palpation and Percussion of the abdomen reveal- Non Tender, Non Distended + BS, no rebound or guarding.   Neurologic Cranial Nerve exam:- CN III-XII intact(No nystagmus), symmetric smile. Strength:- 5/5 equal and symmetric strength both upper and lower extremities.       Assessment & Plan:  For you wellness exam today I have ordered cbc, cmp and lipid panel.  Flu vaccine up to date. Got last week. With insurance can check if cover shingles vaccine.  Recommend exercise and healthy diet.  We will let you know lab results as they come in.  Follow up date appointment will be determined after lab review.    For Obesity- recommend eat healthy diet. Consider [employer].  If you wt not going down can consider wt loss management clinic. Currently at weight loss clinic prescribing phentermine.  Recommend sending gyn question on your recent pap result concerns.  Esperanza Richters, PA-C

## 2021-04-11 NOTE — Patient Instructions (Addendum)
For you wellness exam today I have ordered cbc, cmp and lipid panel.  Flu vaccine up to date. Got last week. With insurance can check if cover shingles vaccine.  Recommend exercise and healthy diet.  We will let you know lab results as they come in.  Follow up date appointment will be determined after lab review.    For Obesity- recommend eat healthy diet. Consider [employer].  If you wt not going down can consider wt loss management clinic. Currently at weight loss clinic prescribing phentermine.  Recommend sending gyn question on your recent pap result concerns.  Elevated/borderline bp levels. Recommend check your bp daily with over the counter blood pressure cuff. Want bp to be less than 140/90.  Preventive Care 61-5 Years Old, Female Preventive care refers to lifestyle choices and visits with your health care provider that can promote health and wellness. Preventive care visits are also called wellness exams. What can I expect for my preventive care visit? Counseling Your health care provider may ask you questions about your: Medical history, including: Past medical problems. Family medical history. Pregnancy history. Current health, including: Menstrual cycle. Method of birth control. Emotional well-being. Home life and relationship well-being. Sexual activity and sexual health. Lifestyle, including: Alcohol, nicotine or tobacco, and drug use. Access to firearms. Diet, exercise, and sleep habits. Work and work Statistician. Sunscreen use. Safety issues such as seatbelt and bike helmet use. Physical exam Your health care provider will check your: Height and weight. These may be used to calculate your BMI (body mass index). BMI is a measurement that tells if you are at a healthy weight. Waist circumference. This measures the distance around your waistline. This measurement also tells if you are at a healthy weight and may help predict your risk of certain diseases, such as  type 2 diabetes and high blood pressure. Heart rate and blood pressure. Body temperature. Skin for abnormal spots. What immunizations do I need? Vaccines are usually given at various ages, according to a schedule. Your health care provider will recommend vaccines for you based on your age, medical history, and lifestyle or other factors, such as travel or where you work. What tests do I need? Screening Your health care provider may recommend screening tests for certain conditions. This may include: Lipid and cholesterol levels. Diabetes screening. This is done by checking your blood sugar (glucose) after you have not eaten for a while (fasting). Pelvic exam and Pap test. Hepatitis B test. Hepatitis C test. HIV (human immunodeficiency virus) test. STI (sexually transmitted infection) testing, if you are at risk. Lung cancer screening. Colorectal cancer screening. Mammogram. Talk with your health care provider about when you should start having regular mammograms. This may depend on whether you have a family history of breast cancer. BRCA-related cancer screening. This may be done if you have a family history of breast, ovarian, tubal, or peritoneal cancers. Bone density scan. This is done to screen for osteoporosis. Talk with your health care provider about your test results, treatment options, and if necessary, the need for more tests. Follow these instructions at home: Eating and drinking  Eat a diet that includes fresh fruits and vegetables, whole grains, lean protein, and low-fat dairy products. Take vitamin and mineral supplements as recommended by your health care provider. Do not drink alcohol if: Your health care provider tells you not to drink. You are pregnant, may be pregnant, or are planning to become pregnant. If you drink alcohol: Limit how much you have  to 0-1 drink a day. Know how much alcohol is in your drink. In the [place] one drink equals one 12 oz bottle of beer (355  mL), one 5 oz glass of wine (148 mL), or one 1 oz glass of hard liquor (44 mL). Lifestyle Brush your teeth every morning and night with fluoride toothpaste. Floss one time each day. Exercise for at least 30 minutes 5 or more days each week. Do not use any products that contain nicotine or tobacco. These products include cigarettes, chewing tobacco, and vaping devices, such as e-cigarettes. If you need help quitting, ask your health care provider. Do not use drugs. If you are sexually active, practice safe sex. Use a condom or other form of protection to prevent STIs. If you do not wish to become pregnant, use a form of birth control. If you plan to become pregnant, see your health care provider for a prepregnancy visit. Take aspirin only as told by your health care provider. Make sure that you understand how much to take and what form to take. Work with your health care provider to find out whether it is safe and beneficial for you to take aspirin daily. Find healthy ways to manage stress, such as: Meditation, yoga, or listening to music. Journaling. Talking to a trusted person. Spending time with friends and family. Minimize exposure to UV radiation to reduce your risk of skin cancer. Safety Always wear your seat belt while driving or riding in a vehicle. Do not drive: If you have been drinking alcohol. Do not ride with someone who has been drinking. When you are tired or distracted. While texting. If you have been using any mind-altering substances or drugs. Wear a helmet and other protective equipment during sports activities. If you have firearms in your house, make sure you follow all gun safety procedures. Seek help if you have been physically or sexually abused. What's next? Visit your health care provider once a year for an annual wellness visit. Ask your health care provider how often you should have your eyes and teeth checked. Stay up to date on all vaccines. This information  is not intended to replace advice given to you by your health care provider. Make sure you discuss any questions you have with your health care provider. Document Revised: 10/27/2020 Document Reviewed: 10/27/2020 Elsevier Patient Education  [place].

## 2021-09-20 ENCOUNTER — Encounter (HOSPITAL_BASED_OUTPATIENT_CLINIC_OR_DEPARTMENT_OTHER): Payer: Self-pay

## 2021-09-20 ENCOUNTER — Ambulatory Visit (HOSPITAL_BASED_OUTPATIENT_CLINIC_OR_DEPARTMENT_OTHER)
Admission: RE | Admit: 2021-09-20 | Discharge: 2021-09-20 | Disposition: A | Discharge status: Home / Self Care | Payer: 59 | Source: Ambulatory Visit | Attending: Family Medicine | Admitting: Family Medicine

## 2021-09-20 DIAGNOSIS — Z01419 Encounter for gynecological examination (general) (routine) without abnormal findings: Secondary | ICD-10-CM | POA: Diagnosis present

## 2021-09-20 DIAGNOSIS — Z113 Encounter for screening for infections with a predominantly sexual mode of transmission: Secondary | ICD-10-CM | POA: Insufficient documentation

## 2021-09-20 DIAGNOSIS — Z1231 Encounter for screening mammogram for malignant neoplasm of breast: Secondary | ICD-10-CM | POA: Insufficient documentation

## 2022-01-25 ENCOUNTER — Encounter: Payer: Self-pay | Admitting: General Practice

## 2022-06-13 ENCOUNTER — Ambulatory Visit (INDEPENDENT_AMBULATORY_CARE_PROVIDER_SITE_OTHER): Payer: 59 | Admitting: Medical

## 2022-06-13 VITALS — BP 150/80 | HR 66 | Temp 98.0°F | Resp 18 | Ht 64.0 in | Wt 207.0 lb

## 2022-06-13 DIAGNOSIS — Z0001 Encounter for general adult medical examination with abnormal findings: Secondary | ICD-10-CM

## 2022-06-13 DIAGNOSIS — R739 Hyperglycemia, unspecified: Secondary | ICD-10-CM

## 2022-06-13 DIAGNOSIS — R030 Elevated blood-pressure reading, without diagnosis of hypertension: Secondary | ICD-10-CM | POA: Diagnosis not present

## 2022-06-13 DIAGNOSIS — Z0000 Encounter for general adult medical examination without abnormal findings: Secondary | ICD-10-CM

## 2022-06-13 DIAGNOSIS — R87619 Unspecified abnormal cytological findings in specimens from cervix uteri: Secondary | ICD-10-CM

## 2022-06-13 DIAGNOSIS — Z23 Encounter for immunization: Secondary | ICD-10-CM

## 2022-06-13 LAB — COMPREHENSIVE METABOLIC PANEL
ALT: 17 U/L (ref 0–35)
AST: 16 U/L (ref 0–37)
Albumin: 4 g/dL (ref 3.5–5.2)
Alkaline Phosphatase: 85 U/L (ref 39–117)
BUN: 12 mg/dL (ref 6–23)
CO2: 28 mEq/L (ref 19–32)
Calcium: 8.8 mg/dL (ref 8.4–10.5)
Chloride: 102 mEq/L (ref 96–112)
Creatinine, Ser: 0.67 mg/dL (ref 0.40–1.20)
GFR: 97.95 mL/min (ref >60.00)
Glucose, Bld: 86 mg/dL (ref 70–99)
Potassium: 3.8 mEq/L (ref 3.5–5.1)
Sodium: 137 mEq/L (ref 135–145)
Total Bilirubin: 0.4 mg/dL (ref 0.2–1.2)
Total Protein: 7.3 g/dL (ref 6.0–8.3)

## 2022-06-13 LAB — CBC WITH DIFFERENTIAL/PLATELET
Basophils Absolute: 0 10*3/uL (ref 0.0–0.1)
Basophils Relative: 0.9 % (ref 0.0–3.0)
Eosinophils Absolute: 0.2 10*3/uL (ref 0.0–0.7)
Eosinophils Relative: 4.9 % (ref 0.0–5.0)
HCT: 36 % (ref 36.0–46.0)
Hemoglobin: 12 g/dL (ref 12.0–15.0)
Lymphocytes Relative: 54.9 % — ABNORMAL HIGH (ref 12.0–46.0)
Lymphs Abs: 2.4 10*3/uL (ref 0.7–4.0)
MCHC: 33.4 g/dL (ref 30.0–36.0)
MCV: 84.3 fl (ref 78.0–100.0)
Monocytes Absolute: 0.2 10*3/uL (ref 0.1–1.0)
Monocytes Relative: 5.3 % (ref 3.0–12.0)
Neutro Abs: 1.5 10*3/uL (ref 1.4–7.7)
Neutrophils Relative %: 34 % — ABNORMAL LOW (ref 43.0–77.0)
Platelets: 252 10*3/uL (ref 150.0–400.0)
RBC: 4.27 Mil/uL (ref 3.87–5.11)
RDW: 13.6 % (ref 11.5–15.5)
WBC: 4.4 10*3/uL (ref 4.0–10.5)

## 2022-06-13 LAB — LIPID PANEL
Cholesterol: 201 mg/dL — ABNORMAL HIGH (ref 0–200)
HDL: 61.2 mg/dL (ref >39.00)
LDL Cholesterol: 128 mg/dL — ABNORMAL HIGH (ref 0–99)
NonHDL: 139.4
Total CHOL/HDL Ratio: 3
Triglycerides: 59 mg/dL (ref 0.0–149.0)
VLDL: 11.8 mg/dL (ref 0.0–40.0)

## 2022-06-13 LAB — HEMOGLOBIN A1C: Hgb A1c MFr Bld: 6.4 % (ref 4.6–6.5)

## 2022-06-13 MED ORDER — LOSARTAN POTASSIUM 50 MG PO TABS: 50.0000 mg | ORAL_TABLET | Freq: Every day | ORAL | 3 refills | Status: DC

## 2022-06-13 NOTE — Progress Notes (Signed)
Subjective:    Patient ID: Rita Hall, female    DOB: 10-27-1965, 57 y.o.   MRN: [id number]  HPI In for wellness exam. Pt is fasting.   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 Pt stopped exercise in April.  Nonsmoker. Alcohol glass of wine once a month. Occasional coffee. Pt thinks eating healthy(avoiding fried foods as much as possible). But does skip breakfast.    Up to date on mammogram. Last year was negative.  Pt did see gynecologist- 03-15-2021. Hpv came up on last pap.   Pt never had shingrix vaccine. She agrees to get.  Pt will get tdap later.    Bp high today. No cardiac or neurologic signs/symptoms. Back 02-06-2021 very high bp. She was very stressed at the time with former job.  Pt does note she is on phentermine. Dose is 37.5 mg daily.   Obesity. She has gained 27 lbs since last seen per her report. Weight gain despite using phentermine. No family hx of thyroid cancer or personal hx of pancreatitis.    Review of Systems  Constitutional:  Negative for chills, fatigue and fever.  HENT:  Negative for congestion and ear discharge.   Respiratory:  Negative for cough, chest tightness, wheezing and stridor.   Cardiovascular:  Negative for chest pain and palpitations.  Gastrointestinal:  Negative for abdominal pain, constipation and nausea.  Genitourinary:  Negative for dyspareunia, dysuria, flank pain, frequency, pelvic pain and urgency.  Musculoskeletal:  Negative for back pain, joint swelling and myalgias.  Skin:  Negative for rash.  Neurological:  Negative for seizures, syncope, facial asymmetry, weakness and headaches.  Psychiatric/Behavioral:  Negative for behavioral problems and decreased concentration.    Past Medical History:  Diagnosis Date   Obese      Social History   Socioeconomic History   Marital status: Divorced    Spouse name: Not on file   Number of children: Not on file   Years of education: Not on file   Highest education level: Not  on file  Occupational History   Not on file  Tobacco Use   Smoking status: Never   Smokeless tobacco: Never  Vaping Use   Vaping Use: Never used  Substance and Sexual Activity   Alcohol use: Yes    Alcohol/week: 1.0 standard drink of alcohol    Types: 1 Glasses of wine per week    Comment: very rare.   Drug use: No   Sexual activity: Not Currently    Birth control/protection: Surgical  Other Topics Concern   Not on file  Social History Narrative   Not on file   Social Determinants of Health   Financial Resource Strain: Not on file  Food Insecurity: Not on file  Transportation Needs: Not on file  Physical Activity: Not on file  Stress: Not on file  Social Connections: Not on file  Intimate Partner Violence: Not on file    Past Surgical History:  Procedure Laterality Date   ABLATION N/A    Uterine   BUNIONECTOMY     CESAREAN SECTION     LIPOSUCTION N/A    TUBAL LIGATION      Family History  Problem Relation Age of Onset   Stroke Father    Hypertension Mother    Diabetes Mother    High Cholesterol Mother     Allergies  Allergen Reactions   Latex Dermatitis   Shellfish Allergy     Current Outpatient Medications on File Prior to Visit  Medication  Sig Dispense Refill   phentermine 37.5 MG capsule Take 37.5 mg by mouth every morning.     No current facility-administered medications on file prior to visit.    BP (!) 150/80   Pulse 66   Temp 98 F (36.7 C)   Resp 18   Ht 5\' 4"  (1.626 m)   Wt 207 lb (93.9 kg)   LMP  (LMP Unknown)   SpO2 98%   BMI 35.53 kg/m         Objective:   Physical Exam   General Mental Status- Alert. General Appearance- Not in acute distress.   Skin General: Color- Normal Color. Moisture- Normal Moisture.  Neck Carotid Arteries- Normal color. Moisture- Normal Moisture. No carotid bruits. No JVD. No thyromegaly.   Chest and Lung Exam Auscultation: Breath Sounds:-Normal.  Cardiovascular Auscultation:Rythm-  Regular. Murmurs & Other Heart Sounds:Auscultation of the heart reveals- No Murmurs.  Abdomen Inspection:-Inspeection Normal. Palpation/Percussion:Note:No mass. Palpation and Percussion of the abdomen reveal- Non Tender, Non Distended + BS, no rebound or guarding.    Neurologic Cranial Nerve exam:- CN III-XII intact(No nystagmus), symmetric smile. Drift Test:- No drift. Finger to Nose:- Normal/Intact Strength:- 5/5 equal and symmetric strength both upper and lower extremities.     Assessment & Plan:   Patient Instructions  For you wellness exam today I have ordered cbc, cmp and lipid panel  Shingrix vaccine.  Recommend exercise and healthy diet.  We will let you know lab results as they come in.  Follow up date appointment will be determined after lab review.    Elevated bp. High enough that recommend startig bp med losartan unless your bp checks at home are much better such as less than 140/90. Rx losartan sent to your pharmacy.  Obesity- recent wt gain. You are on phentermine and appears not effective. Also can increase bp level. New wt loss injection may be option.   Discussed stopping phentermined and seeing if bp will drop event without medication.  Follow up in 2-3 weeks or sooner if needed.      Mackie Pai, [place]   [id number] charge as did discuss elevated bp, obesity and med side effects of phentermine. Discuss possible med such as wegovy for weight loss.

## 2022-06-13 NOTE — Addendum Note (Signed)
Addended by: Jeronimo Greaves on: 06/13/2022 02:39 PM   Modules accepted: Orders

## 2022-06-13 NOTE — Patient Instructions (Addendum)
For you wellness exam today I have ordered cbc, cmp and lipid panel  Shingrix vaccine.  Recommend exercise and healthy diet.  We will let you know lab results as they come in.  Follow up date appointment will be determined after lab review.    Elevated bp. High enough that recommend startig bp med losartan unless your bp checks at home are much better such as less than 140/90. Rx losartan sent to your pharmacy.  Obesity- recent wt gain. You are on phentermine and appears not effective. Also can increase bp level. New wt loss injection may be option.   Discussed stopping phentermine and seeing if bp will drop event without medication.  Follow up in 2-3 weeks or sooner if needed.  Preventive Care 56-81 Years Old, Female Preventive care refers to lifestyle choices and visits with your health care provider that can promote health and wellness. Preventive care visits are also called wellness exams. What can I expect for my preventive care visit? Counseling Your health care provider may ask you questions about your: Medical history, including: Past medical problems. Family medical history. Pregnancy history. Current health, including: Menstrual cycle. Method of birth control. Emotional well-being. Home life and relationship well-being. Sexual activity and sexual health. Lifestyle, including: Alcohol, nicotine or tobacco, and drug use. Access to firearms. Diet, exercise, and sleep habits. Work and work Statistician. Sunscreen use. Safety issues such as seatbelt and bike helmet use. Physical exam Your health care provider will check your: Height and weight. These may be used to calculate your BMI (body mass index). BMI is a measurement that tells if you are at a healthy weight. Waist circumference. This measures the distance around your waistline. This measurement also tells if you are at a healthy weight and may help predict your risk of certain diseases, such as type 2 diabetes  and high blood pressure. Heart rate and blood pressure. Body temperature. Skin for abnormal spots. What immunizations do I need?  Vaccines are usually given at various ages, according to a schedule. Your health care provider will recommend vaccines for you based on your age, medical history, and lifestyle or other factors, such as travel or where you work. What tests do I need? Screening Your health care provider may recommend screening tests for certain conditions. This may include: Lipid and cholesterol levels. Diabetes screening. This is done by checking your blood sugar (glucose) after you have not eaten for a while (fasting). Pelvic exam and Pap test. Hepatitis B test. Hepatitis C test. HIV (human immunodeficiency virus) test. STI (sexually transmitted infection) testing, if you are at risk. Lung cancer screening. Colorectal cancer screening. Mammogram. Talk with your health care provider about when you should start having regular mammograms. This may depend on whether you have a family history of breast cancer. BRCA-related cancer screening. This may be done if you have a family history of breast, ovarian, tubal, or peritoneal cancers. Bone density scan. This is done to screen for osteoporosis. Talk with your health care provider about your test results, treatment options, and if necessary, the need for more tests. Follow these instructions at home: Eating and drinking  Eat a diet that includes fresh fruits and vegetables, whole grains, lean protein, and low-fat dairy products. Take vitamin and mineral supplements as recommended by your health care provider. Do not drink alcohol if: Your health care provider tells you not to drink. You are pregnant, may be pregnant, or are planning to become pregnant. If you drink alcohol: Limit how much  you have to 0-1 drink a day. Know how much alcohol is in your drink. In the [place] one drink equals one 12 oz bottle of beer (355 mL), one 5 oz  glass of wine (148 mL), or one 1 oz glass of hard liquor (44 mL). Lifestyle Brush your teeth every morning and night with fluoride toothpaste. Floss one time each day. Exercise for at least 30 minutes 5 or more days each week. Do not use any products that contain nicotine or tobacco. These products include cigarettes, chewing tobacco, and vaping devices, such as e-cigarettes. If you need help quitting, ask your health care provider. Do not use drugs. If you are sexually active, practice safe sex. Use a condom or other form of protection to prevent STIs. If you do not wish to become pregnant, use a form of birth control. If you plan to become pregnant, see your health care provider for a prepregnancy visit. Take aspirin only as told by your health care provider. Make sure that you understand how much to take and what form to take. Work with your health care provider to find out whether it is safe and beneficial for you to take aspirin daily. Find healthy ways to manage stress, such as: Meditation, yoga, or listening to music. Journaling. Talking to a trusted person. Spending time with friends and family. Minimize exposure to UV radiation to reduce your risk of skin cancer. Safety Always wear your seat belt while driving or riding in a vehicle. Do not drive: If you have been drinking alcohol. Do not ride with someone who has been drinking. When you are tired or distracted. While texting. If you have been using any mind-altering substances or drugs. Wear a helmet and other protective equipment during sports activities. If you have firearms in your house, make sure you follow all gun safety procedures. Seek help if you have been physically or sexually abused. What's next? Visit your health care provider once a year for an annual wellness visit. Ask your health care provider how often you should have your eyes and teeth checked. Stay up to date on all vaccines. This information is not  intended to replace advice given to you by your health care provider. Make sure you discuss any questions you have with your health care provider. Document Revised: 10/27/2020 Document Reviewed: 10/27/2020 Elsevier Patient Education  [place].

## 2022-06-16 ENCOUNTER — Other Ambulatory Visit (HOSPITAL_COMMUNITY)
Admission: RE | Admit: 2022-06-16 | Discharge: 2022-06-16 | Disposition: A | Discharge status: Home / Self Care | Payer: 59 | Source: Ambulatory Visit | Attending: Obstetrics and Gynecology | Admitting: Obstetrics and Gynecology

## 2022-06-16 ENCOUNTER — Ambulatory Visit (INDEPENDENT_AMBULATORY_CARE_PROVIDER_SITE_OTHER): Payer: 59 | Admitting: Obstetrics and Gynecology

## 2022-06-16 ENCOUNTER — Encounter: Payer: Self-pay | Admitting: Obstetrics and Gynecology

## 2022-06-16 VITALS — BP 148/99 | HR 68 | Ht 64.0 in | Wt 207.0 lb

## 2022-06-16 DIAGNOSIS — Z01419 Encounter for gynecological examination (general) (routine) without abnormal findings: Secondary | ICD-10-CM

## 2022-06-16 NOTE — Progress Notes (Signed)
ANNUAL EXAM Patient name: Rita Hall MRN [id number]  Date of birth: 1966/02/20 Chief Complaint:   Annual Exam  History of Present Illness:   Rita Hall is a 57 y.o. G2P2002 being seen today for a routine annual exam.  Current complaints: abnormal pap last year  No LMP recorded (lmp unknown). Patient is postmenopausal.  Not really sexually active, engages in masturbation - no pain or issues. No bowel or urinary concerns. Mammograms up to date.    Last pap     Component Value Date/Time   DIAGPAP  03/24/2021 1605    - Negative for intraepithelial lesion or malignancy (NILM)   HPVHIGH Positive (A) 03/24/2021 1605   ADEQPAP  03/24/2021 1605    Satisfactory for evaluation; transformation zone component PRESENT.     Last mammogram: 09/2021 [facility] 1 Last colonoscopy: reported normal, unsure year     06/13/2022    1:53 PM  Depression screen PHQ 2/9  Decreased Interest 1  Down, Depressed, Hopeless 1  PHQ - 2 Score 2  Tired, decreased energy 1  Change in appetite 1  Feeling bad or failure about yourself  1  Trouble concentrating 1  Moving slowly or fidgety/restless 0  Suicidal thoughts 0         No data to display           Review of Systems:   Pertinent items are noted in HPI Denies any headaches, blurred vision, fatigue, shortness of breath, chest pain, abdominal pain, abnormal vaginal discharge/itching/odor/irritation, problems with periods, bowel movements, urination, or intercourse unless otherwise stated above. Pertinent History Reviewed:  Reviewed past medical,surgical, social and family history.  Reviewed problem list, medications and allergies. Physical Assessment:   Vitals:   06/16/22 1047 06/16/22 1100  BP: (!) 147/91 (!) 148/99  Pulse: 66 68  Weight: 207 lb (93.9 kg)   Height: 5\' 4"  (1.626 m)   Body mass index is 35.53 kg/m.        Physical Examination:   General appearance - well appearing, and in no distress  Mental status - alert, oriented to  person, place, and time  Psych:  She has a normal mood and affect  Skin - warm and dry, normal color, no suspicious lesions noted  Chest - effort normal, all lung fields clear to auscultation bilaterally  Heart - normal rate and regular rhythm  Breasts - breasts appear normal, no suspicious masses, no skin or nipple changes or  axillary nodes  Abdomen - soft, nontender, nondistended, no masses or organomegaly  Pelvic -  VULVA: normal appearing vulva with no masses, tenderness or lesions   VAGINA: normal appearing vagina with normal color and discharge, no lesions   CERVIX: normal appearing cervix without discharge or lesions, though limited visualization   Thin prep pap is done with HR HPV cotesting  UTERUS: uterus is felt to be normal size, shape, consistency and nontender   ADNEXA: No adnexal masses or tenderness noted.  Extremities:  No swelling or varicosities noted  Chaperone present for exam  No results found for this or any previous visit (from the past 24 hour(s)).    Assessment & Plan:  1. Well woman exam with routine gynecological exam No additional concerns. Pap repeated per ASCCP guidelines - Cytology - PAP( [facility]) - MM 3D SCREEN BREAST BILATERAL; Future  Orders Placed This Encounter  Procedures   MM 3D SCREEN BREAST BILATERAL    Meds: No orders of the defined types were placed in this encounter.  Follow-up: No follow-ups on file.  Darliss Cheney, MD 06/16/2022 11:17 AM

## 2022-06-19 ENCOUNTER — Inpatient Hospital Stay (HOSPITAL_BASED_OUTPATIENT_CLINIC_OR_DEPARTMENT_OTHER): Admission: RE | Admit: 2022-06-19 | Payer: 59 | Source: Ambulatory Visit

## 2022-06-21 LAB — CYTOLOGY - PAP
Adequacy: ABSENT
Comment: NEGATIVE
Diagnosis: NEGATIVE
High risk HPV: NEGATIVE

## 2022-06-22 ENCOUNTER — Other Ambulatory Visit: Payer: Self-pay | Admitting: Obstetrics and Gynecology

## 2022-06-22 DIAGNOSIS — B3731 Acute candidiasis of vulva and vagina: Secondary | ICD-10-CM

## 2022-06-22 MED ORDER — FLUCONAZOLE 150 MG PO TABS: 150.0000 mg | ORAL_TABLET | Freq: Once | ORAL | 0 refills | Status: AC

## 2022-06-27 ENCOUNTER — Encounter: Payer: Self-pay | Admitting: Medical

## 2022-06-27 ENCOUNTER — Telehealth: Payer: Self-pay

## 2022-06-27 ENCOUNTER — Ambulatory Visit: Payer: 59 | Admitting: Medical

## 2022-06-27 VITALS — BP 130/85 | HR 79 | Temp 98.5°F | Resp 18 | Ht 64.0 in | Wt 206.0 lb

## 2022-06-27 DIAGNOSIS — R059 Cough, unspecified: Secondary | ICD-10-CM

## 2022-06-27 DIAGNOSIS — G4700 Insomnia, unspecified: Secondary | ICD-10-CM

## 2022-06-27 DIAGNOSIS — U071 COVID-19: Secondary | ICD-10-CM

## 2022-06-27 DIAGNOSIS — R0981 Nasal congestion: Secondary | ICD-10-CM

## 2022-06-27 DIAGNOSIS — I10 Essential (primary) hypertension: Secondary | ICD-10-CM

## 2022-06-27 LAB — POC COVID19 BINAXNOW: SARS Coronavirus 2 Ag: POSITIVE — AB

## 2022-06-27 MED ORDER — SEMAGLUTIDE-WEIGHT MANAGEMENT 2.4 MG/0.75ML ~~LOC~~ SOAJ: 2.4000 mg | SUBCUTANEOUS | 0 refills | Status: AC

## 2022-06-27 MED ORDER — SEMAGLUTIDE-WEIGHT MANAGEMENT 1.7 MG/0.75ML ~~LOC~~ SOAJ: 1.7000 mg | SUBCUTANEOUS | 0 refills | Status: AC

## 2022-06-27 MED ORDER — BENZONATATE 100 MG PO CAPS: 100.0000 mg | ORAL_CAPSULE | Freq: Three times a day (TID) | ORAL | 0 refills | Status: DC | PRN

## 2022-06-27 MED ORDER — SEMAGLUTIDE-WEIGHT MANAGEMENT 0.5 MG/0.5ML ~~LOC~~ SOAJ: 0.5000 mg | SUBCUTANEOUS | 0 refills | Status: AC

## 2022-06-27 MED ORDER — TRAZODONE HCL 50 MG PO TABS: 25.0000 mg | ORAL_TABLET | Freq: Every evening | ORAL | 1 refills | Status: DC | PRN

## 2022-06-27 MED ORDER — NIRMATRELVIR/RITONAVIR (PAXLOVID)TABLET: 3.0000 | ORAL_TABLET | Freq: Two times a day (BID) | ORAL | 0 refills | Status: AC

## 2022-06-27 MED ORDER — ALBUTEROL SULFATE HFA 108 (90 BASE) MCG/ACT IN AERS: 2.0000 | INHALATION_SPRAY | Freq: Four times a day (QID) | RESPIRATORY_TRACT | 0 refills | Status: DC | PRN

## 2022-06-27 MED ORDER — SEMAGLUTIDE-WEIGHT MANAGEMENT 1 MG/0.5ML ~~LOC~~ SOAJ: 1.0000 mg | SUBCUTANEOUS | 0 refills | Status: AC

## 2022-06-27 MED ORDER — SEMAGLUTIDE-WEIGHT MANAGEMENT 0.25 MG/0.5ML ~~LOC~~ SOAJ: 0.2500 mg | SUBCUTANEOUS | 0 refills | Status: AC

## 2022-06-27 MED ORDER — FLUTICASONE PROPIONATE 50 MCG/ACT NA SUSP: 2.0000 | Freq: Every day | NASAL | 1 refills | Status: DC

## 2022-06-27 NOTE — Patient Instructions (Addendum)
Covid infection mild side illness in patient with hx of asthma that is typically well controlled.  Rx paxolvid antivital today. Benzonatate for cough and flonase for nasal congestion.  Also recommend vitamin D over-the-counter 4000 IU daily.  Albuterol for shortness of breath or wheezing.  If signs symptoms worsen or change let us know.  For weight loss/obesity prescribe Wegovy.  Rx advisement given.  Not to start until you recover from [place].  For insomnia prescribed trazodone.  For hypertension-continue losartan 50 mg daily.  Check blood pressure 1-2 times daily.  I do not see insomnia as a common side effect of losartan.  On follow-up visit please bring your blood pressure cuff in.  Might need to increase losartan dose or switch to other BP medication depending on follow-up blood pressure check and review of your readings.  Follow-up in 10 days or sooner if needed.

## 2022-06-27 NOTE — Telephone Encounter (Signed)
PA denied. Plan exclusion.   *Drug Not Covered/Plan Exclusion - May Be Covered Under Medical- Your request for coverage was denied because your prescription benefit plan does not cover the requested medication

## 2022-06-27 NOTE — Telephone Encounter (Signed)
PA initiated via Covermymeds; KEY: [id number]. Awaiting determination.

## 2022-06-27 NOTE — Progress Notes (Signed)
Subjective:    Patient ID: Rita Hall, female    DOB: 30-Apr-1966, 57 y.o.   MRN: [id number]  HPI Pt had low grade fever of 100 last night. Some shivers/chills. Took motrin last night. No fever presently. Last time took motrin was 5 am.   Recently went to concert on Friday.  Pt does have hx of asthma. Pt feel slight sensation in chest like about to start wheeze. Rare asthma flare. States usually when gets uri symptoms lasting more than a week.  Pt had covid vaccines in past. Also states in 2020 had covid and she did well. Brief self limited illness only lasted 2 days.   Htn- bp is well controlled. Pt is on losartan. Notes since being on medication she thinks not sleeping as well. Over past 2 weeks past bp ranged from 0000000 systolic.   Insomnia- pt thinks maybe related to losartan. No palpitation. No tachycardia. Pt never tried melatonin.  Pt has ashwaganda. Has not used  Obesity- we discussed wt loss in past. No fh of thyroid cancer. No hx of pancreatitis.    Review of Systems  Constitutional:  Negative for chills, fatigue and fever.  HENT:  Positive for congestion. Negative for ear pain, hearing loss and mouth sores.   Respiratory:  Positive for cough. Negative for chest tightness, shortness of breath and wheezing.   Cardiovascular:  Negative for chest pain and palpitations.  Gastrointestinal:  Negative for abdominal pain.  Genitourinary:  Negative for dyspareunia and enuresis.  Musculoskeletal:  Negative for back pain.  Neurological:  Negative for dizziness, syncope, weakness, numbness and headaches.  Hematological:  Negative for adenopathy. Does not bruise/bleed easily.  Psychiatric/Behavioral:  Negative for behavioral problems and confusion.     Past Medical History:  Diagnosis Date   Obese      Social History   Socioeconomic History   Marital status: Divorced    Spouse name: Not on file   Number of children: Not on file   Years of education: Not on file    Highest education level: Not on file  Occupational History   Not on file  Tobacco Use   Smoking status: Never   Smokeless tobacco: Never  Vaping Use   Vaping Use: Never used  Substance and Sexual Activity   Alcohol use: Yes    Alcohol/week: 1.0 standard drink of alcohol    Types: 1 Glasses of wine per week    Comment: very rare.   Drug use: Never   Sexual activity: Not Currently    Birth control/protection: Surgical  Other Topics Concern   Not on file  Social History Narrative   Not on file   Social Determinants of Health   Financial Resource Strain: Not on file  Food Insecurity: Not on file  Transportation Needs: Not on file  Physical Activity: Not on file  Stress: Not on file  Social Connections: Not on file  Intimate Partner Violence: Not on file    Past Surgical History:  Procedure Laterality Date   ABLATION N/A    Uterine   BUNIONECTOMY     CESAREAN SECTION     LIPOSUCTION N/A    TUBAL LIGATION      Family History  Problem Relation Age of Onset   Stroke Father    Hypertension Mother    Diabetes Mother    High Cholesterol Mother     Allergies  Allergen Reactions   Latex Dermatitis   Shellfish Allergy     Current Outpatient Medications  on File Prior to Visit  Medication Sig Dispense Refill   losartan (COZAAR) 50 MG tablet Take 1 tablet (50 mg total) by mouth daily. (Patient not taking: Reported on 06/16/2022) 30 tablet 3   No current facility-administered medications on file prior to visit.    BP 120/80   Pulse 79   Temp 98.5 F (36.9 C)   Resp 18   Ht 5' 4"$  (1.626 m)   Wt 206 lb (93.4 kg)   LMP  (LMP Unknown)   SpO2 99%   BMI 35.36 kg/m        Objective:   Physical Exam  General Mental Status- Alert. General Appearance- Not in acute distress.   Skin General: Color- Normal Color. Moisture- Normal Moisture.  Neck Carotid Arteries- Normal color. Moisture- Normal Moisture. No carotid bruits. No JVD.  Chest and Lung  Exam Auscultation: Breath Sounds:-Normal.  Cardiovascular Auscultation:Rythm- Regular. Murmurs & Other Heart Sounds:Auscultation of the heart reveals- No Murmurs.  Abdomen Inspection:-Inspeection Normal. Palpation/Percussion:Note:No mass. Palpation and Percussion of the abdomen reveal- Non Tender, Non Distended + BS, no rebound or guarding.  Neurologic Cranial Nerve exam:- CN III-XII intact(No nystagmus), symmetric smile. Strength:- 5/5 equal and symmetric strength both upper and lower extremities.   Heent- no frontal or maxillary sinus pressure. Canals clear and normal tm.        Assessment & Plan:   Patient Instructions  Covid infection mild side illness in patient with hx of asthma that is typically well controlled.  Rx paxolvid antivital today. Benzonatate for cough and flonase for nasal congestion.  Also recommend vitamin D over-the-counter 4000 IU daily.  Albuterol for shortness of breath or wheezing.  If signs symptoms worsen or change let us know.  For weight loss/obesity prescribe Wegovy.  Rx advisement given.  Not to start until you recover from [place].  For insomnia prescribed trazodone.  For hypertension-continue losartan 50 mg daily.  Check blood pressure 1-2 times daily.  I do not see insomnia as a common side effect of losartan.  On follow-up visit please bring your blood pressure cuff in.  Might need to increase losartan dose or switch to other BP medication depending on follow-up blood pressure check and review of your readings.  Follow-up in 10 days or sooner if needed.

## 2022-06-29 NOTE — Telephone Encounter (Signed)
Ok. Thanks for the update 

## 2022-07-07 ENCOUNTER — Telehealth: Payer: Self-pay | Admitting: Medical

## 2022-07-07 ENCOUNTER — Ambulatory Visit (INDEPENDENT_AMBULATORY_CARE_PROVIDER_SITE_OTHER): Payer: 59 | Admitting: Medical

## 2022-07-07 VITALS — BP 136/80 | Temp 98.0°F | Resp 18 | Ht 64.0 in | Wt 210.8 lb

## 2022-07-07 DIAGNOSIS — E669 Obesity, unspecified: Secondary | ICD-10-CM

## 2022-07-07 DIAGNOSIS — I10 Essential (primary) hypertension: Secondary | ICD-10-CM | POA: Diagnosis not present

## 2022-07-07 NOTE — Patient Instructions (Addendum)
Htn- bp is good when we checked manually. You machine seemed to read about 10 points initially intially but one piece on side not pushed in all the way. When we pushed it in you got readings that were more in line with ours. Recommend proper technique check your machine connections before checking. Check bp daily and update me by my chart in one week.  For weight loss will ask our staff to call and investigate if can get prior auth on wegovy. If that is denied then ask that you call your pharmacy and ask if zepbound is covered.  Follow up date to be deterined after you give me my chart bp update and will update you on wegovy prior auth

## 2022-07-07 NOTE — Telephone Encounter (Signed)
PA was denied in January

## 2022-07-07 NOTE — Progress Notes (Signed)
Subjective:    Patient ID: Rita Hall, female    DOB: March 18, 1966, 57 y.o.   MRN: [id number]  HPI  Pt in for bp check/htn follow up. She brings her machine in for check of montior.   Her machine 148/90. Med assistant to 136/80. When I checked her bp was 135/85 first time. 2nd time I checked was 130/80.  Pt got 172/102 yesterday. All pt reading high with her machine.   I had rx'd wegovy for weight loss. Her insurance denied. She has gained 3 lbs since I last saw her.  Review of Systems  Constitutional:  Negative for chills and fatigue.  Respiratory:  Negative for cough, chest tightness, shortness of breath and wheezing.   Cardiovascular:  Negative for chest pain and palpitations.  Gastrointestinal:  Negative for abdominal pain.  Genitourinary:  Negative for enuresis.  Musculoskeletal:  Negative for back pain.  Neurological:  Negative for dizziness, speech difficulty, numbness and headaches.  Hematological:  Negative for adenopathy. Does not bruise/bleed easily.  Psychiatric/Behavioral:  Negative for behavioral problems and decreased concentration.    Past Medical History:  Diagnosis Date   Obese      Social History   Socioeconomic History   Marital status: Divorced    Spouse name: Not on file   Number of children: Not on file   Years of education: Not on file   Highest education level: Not on file  Occupational History   Not on file  Tobacco Use   Smoking status: Never   Smokeless tobacco: Never  Vaping Use   Vaping Use: Never used  Substance and Sexual Activity   Alcohol use: Yes    Alcohol/week: 1.0 standard drink of alcohol    Types: 1 Glasses of wine per week    Comment: very rare.   Drug use: Never   Sexual activity: Not Currently    Birth control/protection: Surgical  Other Topics Concern   Not on file  Social History Narrative   Not on file   Social Determinants of Health   Financial Resource Strain: Not on file  Food Insecurity: Not on file   Transportation Needs: Not on file  Physical Activity: Not on file  Stress: Not on file  Social Connections: Not on file  Intimate Partner Violence: Not on file    Past Surgical History:  Procedure Laterality Date   ABLATION N/A    Uterine   BUNIONECTOMY     CESAREAN SECTION     LIPOSUCTION N/A    TUBAL LIGATION      Family History  Problem Relation Age of Onset   Stroke Father    Hypertension Mother    Diabetes Mother    High Cholesterol Mother     Allergies  Allergen Reactions   Latex Dermatitis   Shellfish Allergy     Current Outpatient Medications on File Prior to Visit  Medication Sig Dispense Refill   albuterol (VENTOLIN HFA) 108 (90 Base) MCG/ACT inhaler Inhale 2 puffs into the lungs every 6 (six) hours as needed. 18 g 0   benzonatate (TESSALON) 100 MG capsule Take 1 capsule (100 mg total) by mouth 3 (three) times daily as needed for cough. 30 capsule 0   fluticasone (FLONASE) 50 MCG/ACT nasal spray Place 2 sprays into both nostrils daily. 16 g 1   losartan (COZAAR) 50 MG tablet Take 1 tablet (50 mg total) by mouth daily. 30 tablet 3   Semaglutide-Weight Management 0.25 MG/0.5ML SOAJ Inject 0.25 mg into the skin  once a week for 28 days. 2 mL 0   [START ON 07/26/2022] Semaglutide-Weight Management 0.5 MG/0.5ML SOAJ Inject 0.5 mg into the skin once a week for 28 days. 2 mL 0   [START ON 08/24/2022] Semaglutide-Weight Management 1 MG/0.5ML SOAJ Inject 1 mg into the skin once a week for 28 days. 2 mL 0   [START ON 09/22/2022] Semaglutide-Weight Management 1.7 MG/0.75ML SOAJ Inject 1.7 mg into the skin once a week for 28 days. 3 mL 0   [START ON 10/21/2022] Semaglutide-Weight Management 2.4 MG/0.75ML SOAJ Inject 2.4 mg into the skin once a week for 28 days. 3 mL 0   traZODone (DESYREL) 50 MG tablet Take 0.5-1 tablets (25-50 mg total) by mouth at bedtime as needed for sleep. 30 tablet 1   No current facility-administered medications on file prior to visit.    BP 136/80    Temp 98 F (36.7 C)   Resp 18   Ht '5\' 4"'$  (1.626 m)   Wt 210 lb 12.8 oz (95.6 kg)   LMP  (LMP Unknown)   BMI 36.18 kg/m         Objective:   Physical Exam General Mental Status- Alert. General Appearance- Not in acute distress.   Skin General: Color- Normal Color. Moisture- Normal Moisture.  Neck Carotid Arteries- Normal color. Moisture- Normal Moisture. No carotid bruits. No JVD.  Chest and Lung Exam Auscultation: Breath Sounds:-Normal.  Cardiovascular Auscultation:Rythm- Regular. Murmurs & Other Heart Sounds:Auscultation of the heart reveals- No Murmurs.  Abdomen Inspection:-Inspeection Normal. Palpation/Percussion:Note:No mass. Palpation and Percussion of the abdomen reveal- Non Tender, Non Distended + BS, no rebound or guarding.   Neurologic Cranial Nerve exam:- CN III-XII intact(No nystagmus), symmetric smile. Strength:- 5/5 equal and symmetric strength both upper and lower extremities.        Assessment & Plan:   Patient Instructions  Htn- bp is good. You machine seemed to read about 10 points initially but one piece on side not pushed in all the way. When we pushed it in you got readings that were more in line with ours. Recommend proper technique check your machine connections before checking. Check bp daily and update me by my chart in one week.  For weight loss will ask our staff to call and investigate if can get prior auth on wegovy. If that is denied then ask that you call your pharmacy and ask if zepbound is covered.  Follow up date to be deterined after you give me my chart bp update and will update you on wegovy prior auth   Mackie Pai, PA-C

## 2022-07-07 NOTE — Telephone Encounter (Signed)
Could you try to get prior auth on patent wegovy

## 2022-07-12 NOTE — Telephone Encounter (Signed)
Pt not a diabetic so these will not be covered either for wt loss.

## 2022-07-12 NOTE — Telephone Encounter (Signed)
Who Is Calling Patient / Member / Family / Caregiver Caller Name Rita Hall Caller Phone Number [phone] Patient Name Rita Hall Patient DOB 06/20/1965 Call Type Message Only Information Provided Reason for Call Request for General Office Information Initial Comment Caller states she would like to give an update to her doctor. Dr. Harvie Heck put in a Rx, this was denied by her insurance company. She called the office yesterday and she was told to call her insurance to ask about similar medications but ones that would be covered. Pharmacy told her that her doctor has to [place] this. Medications that will be covered by her insurance Ozempic Mounjaro Rybelsus Trulicity Victoza Additional Comment Office hours provided. Triage declined. Disp. Time Disposition Final User 07/11/2022 5:07:17 PM General Information Provided Yes Orrin Brigham Call Closed By: Orrin Brigham Transaction Date/Time: 07/11/2022 5:01:09 PM (ET

## 2022-07-12 NOTE — Telephone Encounter (Signed)
Ozempic Mounjaro Rybelsus Trulicity Victoza     Are covered by patients insurance

## 2022-07-13 ENCOUNTER — Telehealth: Payer: Self-pay | Admitting: Medical

## 2022-07-13 NOTE — Telephone Encounter (Signed)
Could offer referral to healthy weight loss management

## 2022-07-14 NOTE — Telephone Encounter (Signed)
Pt agree'd to referral , referral placed

## 2022-07-14 NOTE — Telephone Encounter (Signed)
Pt called to receive update on alternatives for wegovy that her insurance will cover. Please call pt to discuss.

## 2022-07-14 NOTE — Addendum Note (Signed)
Addended by: Jeronimo Greaves on: 07/14/2022 10:11 AM   Modules accepted: Orders

## 2022-08-27 ENCOUNTER — Other Ambulatory Visit: Payer: Self-pay | Admitting: Medical

## 2022-09-25 ENCOUNTER — Inpatient Hospital Stay (HOSPITAL_BASED_OUTPATIENT_CLINIC_OR_DEPARTMENT_OTHER): Admission: RE | Admit: 2022-09-25 | Payer: 59 | Source: Ambulatory Visit

## 2022-09-26 ENCOUNTER — Ambulatory Visit (HOSPITAL_BASED_OUTPATIENT_CLINIC_OR_DEPARTMENT_OTHER)
Admission: RE | Admit: 2022-09-26 | Discharge: 2022-09-26 | Disposition: A | Discharge status: Home / Self Care | Payer: 59 | Source: Ambulatory Visit | Attending: Obstetrics and Gynecology | Admitting: Obstetrics and Gynecology

## 2022-09-26 DIAGNOSIS — Z01419 Encounter for gynecological examination (general) (routine) without abnormal findings: Secondary | ICD-10-CM | POA: Insufficient documentation

## 2022-09-26 DIAGNOSIS — Z1231 Encounter for screening mammogram for malignant neoplasm of breast: Secondary | ICD-10-CM | POA: Diagnosis not present

## 2022-11-07 ENCOUNTER — Other Ambulatory Visit: Payer: Self-pay | Admitting: Medical

## 2022-11-25 ENCOUNTER — Other Ambulatory Visit: Payer: Self-pay | Admitting: Medical

## 2023-01-12 ENCOUNTER — Telehealth: Payer: 59 | Admitting: Medical

## 2023-01-12 DIAGNOSIS — R062 Wheezing: Secondary | ICD-10-CM | POA: Diagnosis not present

## 2023-01-12 DIAGNOSIS — R059 Cough, unspecified: Secondary | ICD-10-CM | POA: Diagnosis not present

## 2023-01-12 DIAGNOSIS — U071 COVID-19: Secondary | ICD-10-CM

## 2023-01-12 MED ORDER — FLUTICASONE PROPIONATE 50 MCG/ACT NA SUSP: 2.0000 | Freq: Every day | NASAL | 1 refills | Status: DC

## 2023-01-12 MED ORDER — BENZONATATE 100 MG PO CAPS: 100.0000 mg | ORAL_CAPSULE | Freq: Three times a day (TID) | ORAL | 0 refills | Status: DC | PRN

## 2023-01-12 MED ORDER — NIRMATRELVIR/RITONAVIR (PAXLOVID)TABLET: 3.0000 | ORAL_TABLET | Freq: Two times a day (BID) | ORAL | 0 refills | Status: AC

## 2023-01-12 MED ORDER — ALBUTEROL SULFATE HFA 108 (90 BASE) MCG/ACT IN AERS: 2.0000 | INHALATION_SPRAY | Freq: Four times a day (QID) | RESPIRATORY_TRACT | 0 refills | Status: DC | PRN

## 2023-01-12 NOTE — Patient Instructions (Signed)
COVID-19 Positive test with symptoms starting two days ago. Symptoms include runny nose, headache, low-grade fever, chills, and dry cough. Noted wheezing this morning. Vaccinated with two doses and two boosters. History of prior COVID infection. -Prescribe Paxlovid. -Continue Flonase nasal spray. -Alert provider if symptoms worsen or if productive cough develops. -benzonate for cough  Asthma History of asthma with recent onset of wheezing. -Prescribe Albuterol inhaler for use as needed. -Consider chest x-ray and antibiotics if symptoms of secondary bacterial infection develop.   If sign/symptoms worsen or change notify us. particular secondary bacterial infection or worse wheezing.  Follow-up If symptoms persist for more than seven days, schedule an office visit. Patient can send a MyChart message with any questions.

## 2023-01-12 NOTE — Progress Notes (Signed)
   Subjective:    Patient ID: Rita Hall, female    DOB: 29-May-1965, 57 y.o.   MRN: [id number]  HPI  Virtual Visit via Video Note  I connected with Rita Hall on 01/12/23 at  8:40 AM EDT by a video enabled telemedicine application and verified that I am speaking with the correct person using two identifiers.  Location: Patient: home Provider: office   I discussed the limitations of evaluation and management by telemedicine and the availability of in person appointments. The patient expressed understanding and agreed to proceed.   History of Present Illness:  Discussed the use of AI scribe software for clinical note transcription with the patient, who gave verbal consent to proceed.  History of Present Illness   The patient, with a history of asthma, tested positive for COVID-19. She first noticed symptoms two days prior to the consultation, beginning with a runny nose and headache. The following day, she developed a fever and took Motrin for symptom relief. The patient noted that her symptoms were progressively worsening, unlike a typical cold. She also reported a dry cough and mild body aches.. She experienced chills and a low-grade fever, confirmed by self-monitoring. The patient also noted the onset of  mildwheezing on the day of the consultation. But pt does not have 02 sat monitor.   In the past, the patient had a COVID-19 infection around February. In the past received two COVID-19 vaccination shots and two boosters. She denied any history of smoking. The patient had been prescribed Flonase and Paxlovid previously, but did not fill prior  albuterol inhaler.        Observations/Objective: General-no acute distress, pleasant, oriented. Lungs- on inspection lungs appear unlabored. Neck- no tracheal deviation or jvd on inspection. Neuro- gross motor function appears intact.   Assessment and Plan: Assessment and Plan    COVID-19 Positive test with symptoms starting two days ago.  Symptoms include runny nose, headache, low-grade fever, chills, and dry cough. Noted wheezing this morning. Vaccinated with two doses and two boosters. History of prior COVID infection. -Prescribe Paxlovid. -Continue Flonase nasal spray. -Alert provider if symptoms worsen or if productive cough develops. -benzonate for cough  Asthma History of asthma with recent onset of wheezing. -Prescribe Albuterol inhaler for use as needed. -Consider chest x-ray and antibiotics if symptoms of secondary bacterial infection develop.   If sign/symptoms worsen or change notify us. particular secondary bacterial infection or worse wheezing.  Follow-up If symptoms persist for more than seven days, schedule an office visit. Patient can send a MyChart message with any questions.        Follow Up Instructions:    I discussed the assessment and treatment plan with the patient. The patient was provided an opportunity to ask questions and all were answered. The patient agreed with the plan and demonstrated an understanding of the instructions.   The patient was advised to call back or seek an in-person evaluation if the symptoms worsen or if the condition fails to improve as anticipated.     Esperanza Richters, PA-C   Review of Systems     Objective:   Physical Exam        Assessment & Plan:

## 2023-02-05 ENCOUNTER — Other Ambulatory Visit (HOSPITAL_COMMUNITY): Payer: Self-pay

## 2023-02-08 ENCOUNTER — Telehealth: Payer: Self-pay | Admitting: Medical

## 2023-02-08 NOTE — Telephone Encounter (Signed)
Pt called to advise her pharmacy has been unable to get wegovy In stock. She said they didn't offer any other alternative locations. Pt said they have mounjaro In stock so would like for her provider to call in another similar medcation. Please call her to advise

## 2023-02-12 NOTE — Telephone Encounter (Signed)
Pt stated she never started wegovy yet due to the pharmacy not having medication in stock , made her aware to call around and see what pharmacies have it in stock , pt stated she was once on mounjaro 0.5 mg and It worked as well

## 2023-02-12 NOTE — Telephone Encounter (Signed)
Kinlie called back to update. She stated that per the pharmacy, their manufacturer will not be supplying or shipping the medication until mid October and that there is no guarantee on what the dosage it will be. She mentioned that the pharmacist also [facility] with [facility]. Please advise pt.

## 2023-02-14 ENCOUNTER — Other Ambulatory Visit (HOSPITAL_BASED_OUTPATIENT_CLINIC_OR_DEPARTMENT_OTHER): Payer: Self-pay

## 2023-02-14 MED ORDER — SEMAGLUTIDE-WEIGHT MANAGEMENT 0.25 MG/0.5ML ~~LOC~~ SOAJ: 0.2500 mg | SUBCUTANEOUS | 1 refills | Status: DC

## 2023-02-14 MED ORDER — SEMAGLUTIDE-WEIGHT MANAGEMENT 0.25 MG/0.5ML ~~LOC~~ SOAJ
0.2500 mg | SUBCUTANEOUS | 1 refills | Status: DC
  Filled 2023-02-14: qty 2, 28d supply, fill #0

## 2023-02-14 NOTE — Telephone Encounter (Signed)
Pt called back to advise that wegovy was sent in which is out of stock. She was requesting to be switched to Rita Hall. Please review and call patient to advise. She wasn't sure if provider denied the switch or not but the wrong medicine was sent in.

## 2023-02-14 NOTE — Telephone Encounter (Signed)
Pt hasn't taken does of 2.4 mg , was unable to start med

## 2023-02-14 NOTE — Addendum Note (Signed)
Addended by: Gwenevere Abbot on: 02/14/2023 12:38 PM   Modules accepted: Orders

## 2023-02-15 NOTE — Telephone Encounter (Signed)
Rita Hall needs a PA , but from earlier telephone notes wegovy wasn't covered , but   Mounjaro,ozmepic were but I did send the PA over to the PA team to see if it will be approved now. Did you want to try [facility]?

## 2023-02-15 NOTE — Telephone Encounter (Signed)
Pt needs PA for wegovy , wegovy was sent to [facility] which has it in stock.

## 2023-02-16 ENCOUNTER — Other Ambulatory Visit (HOSPITAL_BASED_OUTPATIENT_CLINIC_OR_DEPARTMENT_OTHER): Payer: Self-pay

## 2023-02-16 MED ORDER — TIRZEPATIDE 2.5 MG/0.5ML ~~LOC~~ SOAJ
2.5000 mg | SUBCUTANEOUS | 0 refills | Status: DC
  Filled 2023-02-16: qty 2, 28d supply, fill #0

## 2023-02-16 NOTE — Addendum Note (Signed)
Addended by: Gwenevere Abbot on: 02/16/2023 01:02 PM   Modules accepted: Orders

## 2023-02-26 ENCOUNTER — Telehealth: Payer: Self-pay

## 2023-02-26 ENCOUNTER — Other Ambulatory Visit (HOSPITAL_COMMUNITY): Payer: Self-pay

## 2023-02-26 NOTE — Telephone Encounter (Signed)
Pharmacy Patient Advocate Encounter   Received notification from Physician's Office that prior authorization for [facility] is required/requested.   Insurance verification completed.   The patient is insured through [facility] .   Per test claim: PA required; PA submitted to [facility] via CoverMyMeds Key/confirmation #/EOC (Key: [id number]) Status is pending

## 2023-02-27 NOTE — Telephone Encounter (Signed)
Pharmacy Patient Advocate Encounter  Received notification from [facility] that Prior Authorization for [facility] has been DENIED.  Full denial letter will be uploaded to the media tab. See denial reason below.   PA #/Case ID/Reference #: [id number]   DENIAL REASON: Plan exclusion

## 2023-03-08 ENCOUNTER — Other Ambulatory Visit (HOSPITAL_BASED_OUTPATIENT_CLINIC_OR_DEPARTMENT_OTHER): Payer: Self-pay

## 2023-03-08 ENCOUNTER — Other Ambulatory Visit: Payer: Self-pay | Admitting: Medical

## 2023-03-08 MED ORDER — TIRZEPATIDE 5 MG/0.5ML ~~LOC~~ SOAJ
5.0000 mg | SUBCUTANEOUS | 0 refills | Status: DC
  Filled 2023-03-08: qty 2, 28d supply, fill #0

## 2023-03-08 NOTE — Addendum Note (Signed)
Addended by: Gwenevere Abbot on: 03/08/2023 05:40 PM   Modules accepted: Orders

## 2023-03-09 ENCOUNTER — Other Ambulatory Visit (HOSPITAL_BASED_OUTPATIENT_CLINIC_OR_DEPARTMENT_OTHER): Payer: Self-pay

## 2023-04-02 ENCOUNTER — Other Ambulatory Visit: Payer: Self-pay | Admitting: Medical

## 2023-04-03 MED ORDER — TIRZEPATIDE 5 MG/0.5ML ~~LOC~~ SOAJ
5.0000 mg | SUBCUTANEOUS | 0 refills | Status: DC
  Filled 2023-04-03: qty 2, 28d supply, fill #0
  Filled 2023-04-30: qty 2, 28d supply, fill #1

## 2023-04-03 NOTE — Telephone Encounter (Signed)
Mounjaro rx sent to pharmacy.

## 2023-04-04 ENCOUNTER — Other Ambulatory Visit (HOSPITAL_BASED_OUTPATIENT_CLINIC_OR_DEPARTMENT_OTHER): Payer: Self-pay

## 2023-04-06 ENCOUNTER — Other Ambulatory Visit: Payer: Self-pay | Admitting: Medical

## 2023-04-06 ENCOUNTER — Other Ambulatory Visit (HOSPITAL_BASED_OUTPATIENT_CLINIC_OR_DEPARTMENT_OTHER): Payer: Self-pay

## 2023-06-01 ENCOUNTER — Other Ambulatory Visit: Payer: Self-pay | Admitting: Medical

## 2023-06-04 ENCOUNTER — Other Ambulatory Visit (HOSPITAL_BASED_OUTPATIENT_CLINIC_OR_DEPARTMENT_OTHER): Payer: Self-pay

## 2023-06-04 MED ORDER — MOUNJARO 5 MG/0.5ML ~~LOC~~ SOAJ
5.0000 mg | SUBCUTANEOUS | 0 refills | Status: DC
  Filled 2023-06-04: qty 2, 28d supply, fill #0

## 2023-06-05 ENCOUNTER — Encounter: Payer: 59 | Admitting: Medical

## 2023-06-18 ENCOUNTER — Ambulatory Visit (INDEPENDENT_AMBULATORY_CARE_PROVIDER_SITE_OTHER): Payer: 59 | Admitting: Medical

## 2023-06-18 ENCOUNTER — Other Ambulatory Visit (HOSPITAL_BASED_OUTPATIENT_CLINIC_OR_DEPARTMENT_OTHER): Payer: Self-pay

## 2023-06-18 VITALS — BP 160/100 | HR 78 | Resp 18 | Ht 64.0 in | Wt 207.6 lb

## 2023-06-18 DIAGNOSIS — E669 Obesity, unspecified: Secondary | ICD-10-CM | POA: Diagnosis not present

## 2023-06-18 DIAGNOSIS — E785 Hyperlipidemia, unspecified: Secondary | ICD-10-CM | POA: Diagnosis not present

## 2023-06-18 DIAGNOSIS — R739 Hyperglycemia, unspecified: Secondary | ICD-10-CM | POA: Diagnosis not present

## 2023-06-18 DIAGNOSIS — I10 Essential (primary) hypertension: Secondary | ICD-10-CM

## 2023-06-18 MED ORDER — TIRZEPATIDE 7.5 MG/0.5ML ~~LOC~~ SOAJ
7.5000 mg | SUBCUTANEOUS | 0 refills | Status: DC
  Filled 2023-06-18: qty 2, 28d supply, fill #0

## 2023-06-18 MED ORDER — LOSARTAN POTASSIUM 100 MG PO TABS
100.0000 mg | ORAL_TABLET | Freq: Every day | ORAL | 0 refills | Status: DC
  Filled 2023-06-18: qty 30, 30d supply, fill #0

## 2023-06-18 NOTE — Progress Notes (Signed)
Subjective:    Patient ID: Rita Hall, female    DOB: April 07, 1966, 58 y.o.   MRN: [id number]  HPI  Discussed the use of AI scribe software for clinical note transcription with the patient, who gave verbal consent to proceed.  History of Present Illness   The patient presents for follow-up on blood pressure, A1c, and weight management.  The patient has been using Mounjaro for weight management for over five months, initially prescribed after [facility] was unavailable. She is unsure if Greggory Keen is for weight loss or A1c management but reports no significant weight loss/some gain in weight. Her last injection was the previous night, and she has two pens remaining. She recently refilled her prescription.  She monitors her blood pressure at home, noting it is sometimes better than in the clinic. However, today's reading was high at 160/100 mmHg, compared to previous readings of 136/80 mmHg and 130/85 mmHg. She attributes the high reading to work stress and has experienced a headache, likely due to not eating or drinking since morning.  A year ago, her A1c was 6.4, close to the diabetic range. She has not eaten breakfast today and is fasting for lab work to check her A1c and kidney function.  She reports minimal exercise, attributing it to a lack of motivation rather than depression. Her routine is described as 'work, shower, pajamas, bed, watch TV.' Mounjaro reduces her appetite and cravings, leading to better food choices, such as eating more fish instead of red meat. She does not consume much junk food but may not eat frequently enough, typically once or twice a day, often late in the day.  No vision changes, weakness in hands or legs, or dizziness. She reports a headache, likely due to not eating or drinking. No motor or sensory defcits.       Past Medical History:  Diagnosis Date   Obese      Social History   Socioeconomic History   Marital status: Divorced    Spouse name: Not on file    Number of children: Not on file   Years of education: Not on file   Highest education level: Master's degree (e.g., MA, MS, MEng, MEd, MSW, MBA)  Occupational History   Not on file  Tobacco Use   Smoking status: Never   Smokeless tobacco: Never  Vaping Use   Vaping status: Never Used  Substance and Sexual Activity   Alcohol use: Yes    Alcohol/week: 1.0 standard drink of alcohol    Types: 1 Glasses of wine per week    Comment: very rare.   Drug use: Never   Sexual activity: Not Currently    Birth control/protection: Surgical  Other Topics Concern   Not on file  Social History Narrative   Not on file   Social Drivers of Health   Financial Resource Strain: Low Risk  (06/16/2023)   Overall Financial Resource Strain (CARDIA)    Difficulty of Paying Living Expenses: Not very hard  Food Insecurity: No Food Insecurity (06/16/2023)   Hunger Vital Sign    Worried About Running Out of Food in the Last Year: Never true    Ran Out of Food in the Last Year: Never true  Transportation Needs: No Transportation Needs (06/16/2023)   PRAPARE - Administrator, Civil Service (Medical): No    Lack of Transportation (Non-Medical): No  Physical Activity: Unknown (06/16/2023)   Exercise Vital Sign    Days of Exercise per Week: 0 days  Minutes of Exercise per Session: Not on file  Stress: No Stress Concern Present (06/16/2023)   [employer] of Occupational Health - Occupational Stress Questionnaire    Feeling of Stress : Only a little  Social Connections: Socially Isolated (06/16/2023)   Social Connection and Isolation Panel [NHANES]    Frequency of Communication with Friends and Family: More than three times a week    Frequency of Social Gatherings with Friends and Family: Once a week    Attends Religious Services: Never    Database administrator or Organizations: No    Attends Engineer, structural: Not on file    Marital Status: Divorced  Catering manager Violence: Not on  file    Past Surgical History:  Procedure Laterality Date   ABLATION N/A    Uterine   BUNIONECTOMY     CESAREAN SECTION     LIPOSUCTION N/A    TUBAL LIGATION      Family History  Problem Relation Age of Onset   Stroke Father    Hypertension Mother    Diabetes Mother    High Cholesterol Mother     Allergies  Allergen Reactions   Latex Dermatitis   Shellfish Allergy     Current Outpatient Medications on File Prior to Visit  Medication Sig Dispense Refill   tirzepatide (MOUNJARO) 5 MG/0.5ML Pen Inject 5 mg into the skin once a week. 4 mL 0   No current facility-administered medications on file prior to visit.    BP (!) 160/100 Comment: ESPAC  Pulse 78   Resp 18   Ht 5\' 4"  (1.626 m)   Wt 207 lb 9.6 oz (94.2 kg)   LMP  (LMP Unknown)   SpO2 100%   BMI 35.63 kg/m          Review of Systems  Constitutional:  Negative for chills, fatigue and fever.  HENT:  Negative for congestion and facial swelling.   Respiratory:  Negative for cough, chest tightness, shortness of breath and wheezing.   Cardiovascular:  Negative for chest pain and palpitations.  Gastrointestinal:  Negative for abdominal pain, constipation and diarrhea.  Musculoskeletal:  Negative for back pain and joint swelling.  Skin:  Negative for rash.  Neurological:  Positive for headaches. Negative for dizziness.       Low level ha just started. Pt states did not eat anything yet and has not drank much. She states probable dehydrtion ha.  Hematological:  Negative for adenopathy.  Psychiatric/Behavioral:  Positive for dysphoric mood. Negative for behavioral problems, decreased concentration and suicidal ideas. The patient is not nervous/anxious and is not hyperactive.     Past Medical History:  Diagnosis Date   Obese      Social History   Socioeconomic History   Marital status: Divorced    Spouse name: Not on file   Number of children: Not on file   Years of education: Not on file   Highest  education level: Master's degree (e.g., MA, MS, MEng, MEd, MSW, MBA)  Occupational History   Not on file  Tobacco Use   Smoking status: Never   Smokeless tobacco: Never  Vaping Use   Vaping status: Never Used  Substance and Sexual Activity   Alcohol use: Yes    Alcohol/week: 1.0 standard drink of alcohol    Types: 1 Glasses of wine per week    Comment: very rare.   Drug use: Never   Sexual activity: Not Currently    Birth control/protection: Surgical  Other Topics Concern   Not on file  Social History Narrative   Not on file   Social Drivers of Health   Financial Resource Strain: Low Risk  (06/16/2023)   Overall Financial Resource Strain (CARDIA)    Difficulty of Paying Living Expenses: Not very hard  Food Insecurity: No Food Insecurity (06/16/2023)   Hunger Vital Sign    Worried About Running Out of Food in the Last Year: Never true    Ran Out of Food in the Last Year: Never true  Transportation Needs: No Transportation Needs (06/16/2023)   PRAPARE - Administrator, Civil Service (Medical): No    Lack of Transportation (Non-Medical): No  Physical Activity: Unknown (06/16/2023)   Exercise Vital Sign    Days of Exercise per Week: 0 days    Minutes of Exercise per Session: Not on file  Stress: No Stress Concern Present (06/16/2023)   [employer] of Occupational Health - Occupational Stress Questionnaire    Feeling of Stress : Only a little  Social Connections: Socially Isolated (06/16/2023)   Social Connection and Isolation Panel [NHANES]    Frequency of Communication with Friends and Family: More than three times a week    Frequency of Social Gatherings with Friends and Family: Once a week    Attends Religious Services: Never    Database administrator or Organizations: No    Attends Engineer, structural: Not on file    Marital Status: Divorced  Catering manager Violence: Not on file    Past Surgical History:  Procedure Laterality Date   ABLATION N/A     Uterine   BUNIONECTOMY     CESAREAN SECTION     LIPOSUCTION N/A    TUBAL LIGATION      Family History  Problem Relation Age of Onset   Stroke Father    Hypertension Mother    Diabetes Mother    High Cholesterol Mother     Allergies  Allergen Reactions   Latex Dermatitis   Shellfish Allergy     Current Outpatient Medications on File Prior to Visit  Medication Sig Dispense Refill   tirzepatide (MOUNJARO) 5 MG/0.5ML Pen Inject 5 mg into the skin once a week. 4 mL 0   No current facility-administered medications on file prior to visit.    BP (!) 160/100 Comment: ESPAC  Pulse 78   Resp 18   Ht 5\' 4"  (1.626 m)   Wt 207 lb 9.6 oz (94.2 kg)   LMP  (LMP Unknown)   SpO2 100%   BMI 35.63 kg/m        Objective:   Physical Exam  General Mental Status- Alert. General Appearance- Not in acute distress.     Neck No JVD.  Chest and Lung Exam Auscultation: Breath Sounds:-Normal.  Cardiovascular Auscultation:Rythm- Regular. Murmurs & Other Heart Sounds:Auscultation of the heart reveals- No Murmurs.  Abdomen Inspection:-Inspeection Normal. Palpation/Percussion:Note:No mass. Palpation and Percussion of the abdomen reveal- Non Tender, Non Distended + BS, no rebound or guarding.   Neurologic Cranial Nerve exam:- CN III-XII intact(No nystagmus), symmetric smile. Strength:- 5/5 equal and symmetric strength both upper and lower extremities.       Assessment & Plan:  Assessment and Plan    Obesity On Mounjaro for 5 months with no significant weight loss reported. Patient has poor appetite control and sedentary lifestyle. -Continue Mounjaro 5mg  until completion, then increase to 7.5mg  weekly. -Plan to refer to nutritionist for dietary counseling pending A1c results. -hopefully  will see some weight loss. When rx med consider benefit vs risk. If not getting benefit don't want to subject you to risk long term.  -consider weight loss management  specialist.  Hypertension Elevated blood pressure reading today (160/100), possibly due to work stress. Patient is currently on Losartan. -Increase Losartan to 100mg  daily. -Advise patient to monitor blood pressure at home, especially during periods of stress. -Schedule nurse blood pressure check in 7-8 days, and bring home blood pressure machine for calibration.  Prediabetes Previous A1c of 6.4, close to diabetic range. Patient reports improved dietary choices. -Check A1c and metabolic panel today. -If A1c has increased, refer to nutritionist for diabetic education and weight loss counseling.  Hyperlipidemia Last cholesterol check was 2 years ago. -Check lipid panel today.  Depression Patient reports lack of motivation, possibly related to weight. low phq-9 score. Gad 7 score 0.  -Monitor for progression of symptoms.   Nurse bp check in one week.    Follow up based on bp check in one week and weight loss update in 3 weeks.(as well as todays labs)        Esperanza Richters, PA-C

## 2023-06-18 NOTE — Patient Instructions (Signed)
Obesity On Mounjaro for 5 months with no significant weight loss reported. Patient has poor appetite control and sedentary lifestyle. -Continue Mounjaro 5mg  until completion, then increase to 7.5mg  weekly. -Plan to refer to nutritionist for dietary counseling pending A1c results. -hopefully will see some weight loss. When rx med consider benefit vs risk. If not getting benefit don't want to subject you to risk long term.  -consider weight loss management specialist.  Hypertension Elevated blood pressure reading today (160/100), possibly due to work stress. Patient is currently on Losartan. -Increase Losartan to 100mg  daily. -Advise patient to monitor blood pressure at home, especially during periods of stress. -Schedule nurse blood pressure check in 7-8 days, and bring home blood pressure machine for calibration.  Prediabetes Previous A1c of 6.4, close to diabetic range. Patient reports improved dietary choices. -Check A1c and metabolic panel today. -If A1c has increased, refer to nutritionist for diabetic education and weight loss counseling.  Hyperlipidemia Last cholesterol check was 2 years ago. -Check lipid panel today.  Depression Patient reports lack of motivation, possibly related to weight. low phq-9 score. Gad 7 score 0.  -Monitor for progression of symptoms.   Nurse bp check in one week.    Follow up based on bp check in one week and weight loss update in 3 weeks.(as well as todays labs)

## 2023-06-19 LAB — LIPID PANEL
Cholesterol: 206 mg/dL — ABNORMAL HIGH (ref 0–200)
HDL: 58.9 mg/dL (ref >39.00)
LDL Cholesterol: 136 mg/dL — ABNORMAL HIGH (ref 0–99)
NonHDL: 147.11
Total CHOL/HDL Ratio: 3
Triglycerides: 58 mg/dL (ref 0.0–149.0)
VLDL: 11.6 mg/dL (ref 0.0–40.0)

## 2023-06-19 LAB — HEMOGLOBIN A1C: Hgb A1c MFr Bld: 6 % (ref 4.6–6.5)

## 2023-06-19 LAB — COMPREHENSIVE METABOLIC PANEL
ALT: 22 U/L (ref 0–35)
AST: 21 U/L (ref 0–37)
Albumin: 4 g/dL (ref 3.5–5.2)
Alkaline Phosphatase: 95 U/L (ref 39–117)
BUN: 14 mg/dL (ref 6–23)
CO2: 28 meq/L (ref 19–32)
Calcium: 9.1 mg/dL (ref 8.4–10.5)
Chloride: 104 meq/L (ref 96–112)
Creatinine, Ser: 0.74 mg/dL (ref 0.40–1.20)
GFR: 90.03 mL/min (ref >60.00)
Glucose, Bld: 90 mg/dL (ref 70–99)
Potassium: 4.1 meq/L (ref 3.5–5.1)
Sodium: 141 meq/L (ref 135–145)
Total Bilirubin: 0.5 mg/dL (ref 0.2–1.2)
Total Protein: 7.5 g/dL (ref 6.0–8.3)

## 2023-06-20 ENCOUNTER — Encounter: Payer: Self-pay | Admitting: Medical

## 2023-06-26 ENCOUNTER — Ambulatory Visit: Payer: 59

## 2023-06-26 ENCOUNTER — Encounter: Payer: 59 | Admitting: Medical

## 2023-06-26 DIAGNOSIS — I10 Essential (primary) hypertension: Secondary | ICD-10-CM

## 2023-06-26 MED ORDER — HYDROCHLOROTHIAZIDE 12.5 MG PO CAPS: 12.5000 mg | ORAL_CAPSULE | Freq: Every day | ORAL | 0 refills | Status: DC

## 2023-06-26 NOTE — Progress Notes (Addendum)
Pt here for Blood pressure check per Ramon Dredge Saguoier,PA-C  Pt currently takes: Losartan 100 mg   Pt reports compliance with medication.  BP today @ =manual  ( left arm) 156/100 HR =75 (Right arm) 158/98    Personal machine : 168/107 p 75    5 minute wait: Manual 140/88 ( left arm)  Pt advised per PCP to follow up in 2 weeks and start hydrochlorothiazide and keep a record of blood pressure readings after going to the pharmacy to try to get the machine calibrated.

## 2023-06-26 NOTE — Progress Notes (Signed)
Rx hydrochlorothiazide 12.5 mg daily dose. Continue losaratan 100 mg daily. Follow up in 2 weeks appt with me. Nurse visit. Not office visit chart.  Esperanza Richters, PA-C This encounter was created in error - please disregard.

## 2023-07-23 ENCOUNTER — Other Ambulatory Visit: Payer: Self-pay | Admitting: Medical

## 2023-07-23 ENCOUNTER — Telehealth: Payer: Self-pay | Admitting: *Deleted

## 2023-07-23 ENCOUNTER — Other Ambulatory Visit (HOSPITAL_BASED_OUTPATIENT_CLINIC_OR_DEPARTMENT_OTHER): Payer: Self-pay

## 2023-07-23 MED ORDER — MOUNJARO 7.5 MG/0.5ML ~~LOC~~ SOAJ
7.5000 mg | SUBCUTANEOUS | 0 refills | Status: DC
  Filled 2023-07-23: qty 2, 28d supply, fill #0

## 2023-07-23 NOTE — Telephone Encounter (Unsigned)
 Copied from CRM [id number]. Topic: Clinical - Medication Question >> Jul 23, 2023  2:48 PM Adaysia C wrote: Reason for CRM: Patient has called to ask the provider if she is supposed to continue the hydrochlorothiazide (MICROZIDE) 12.5 MG capsule or should she stop taking it and take her prior medication; please follow up with patient [phone]

## 2023-07-26 NOTE — Telephone Encounter (Signed)
 Pt called and lvm to return call

## 2023-08-10 IMAGING — MG MM DIGITAL SCREENING BILAT W/ TOMO AND CAD
6 of 12 series · 6 of 36 positions shown · non-contrast
Comparison: Previous exam(s).

CLINICAL DATA: Screening.

EXAM:
DIGITAL SCREENING BILATERAL MAMMOGRAM WITH TOMOSYNTHESIS AND CAD
TECHNIQUE: Bilateral screening digital craniocaudal and mediolateral oblique
mammograms were obtained. Bilateral screening digital breast
tomosynthesis was performed. The images were evaluated with
computer-aided detection.

[L MLO synth-2D (1 of 2)]
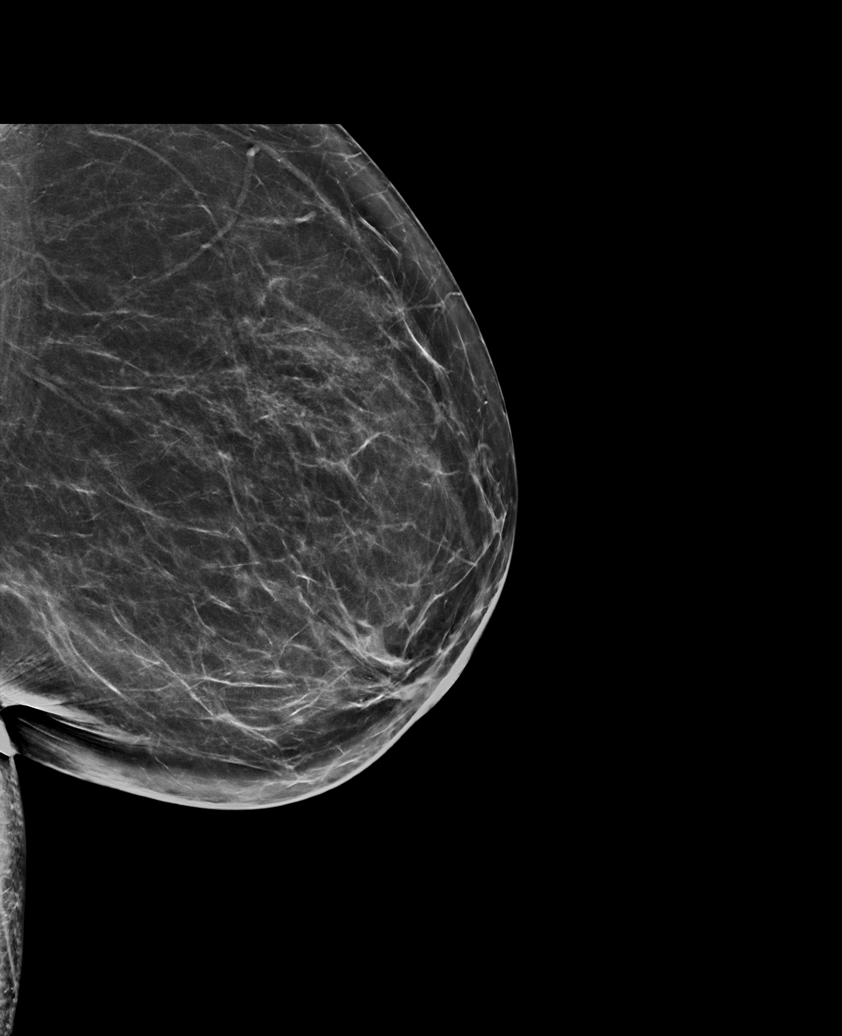

[R CC synth-2D]
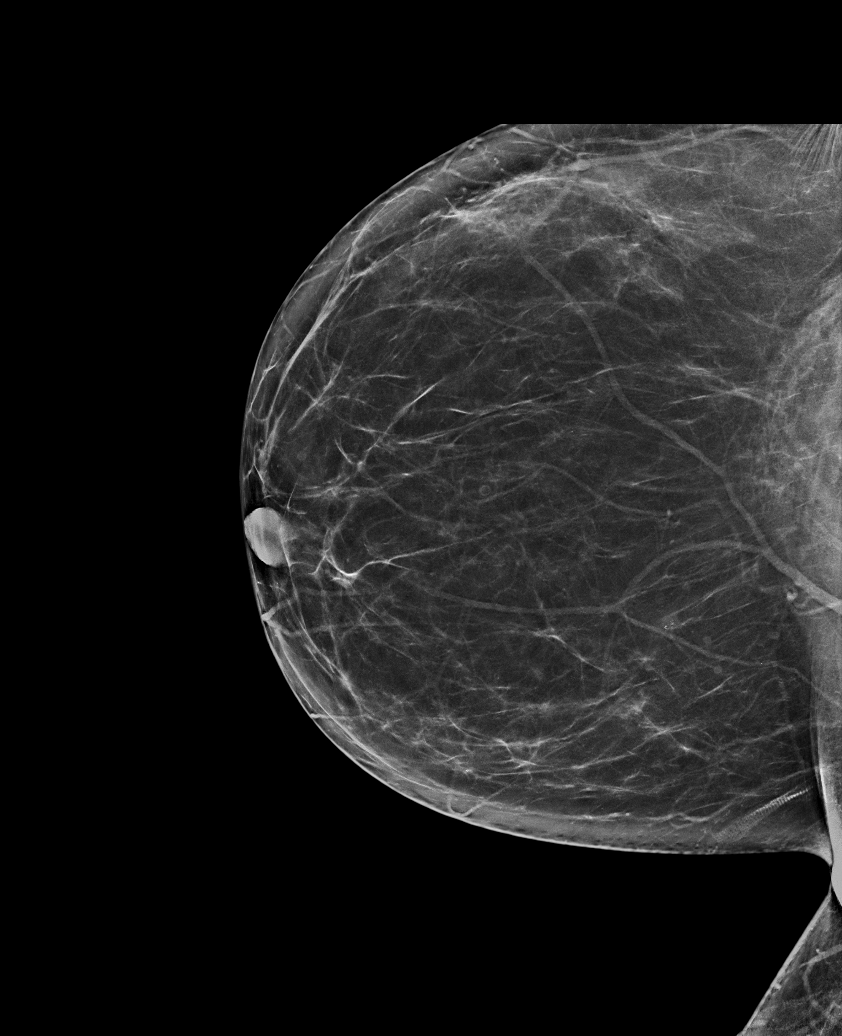

[L MLO synth-2D (2 of 2)]
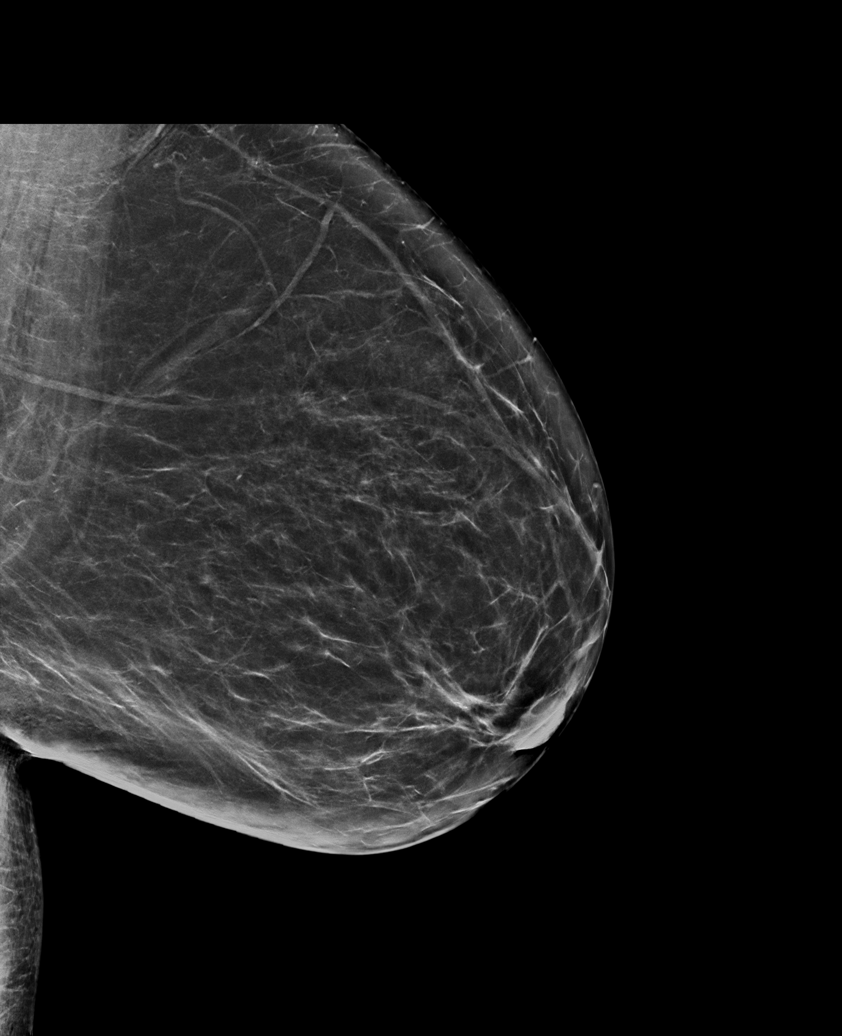

[L CC synth-2D]
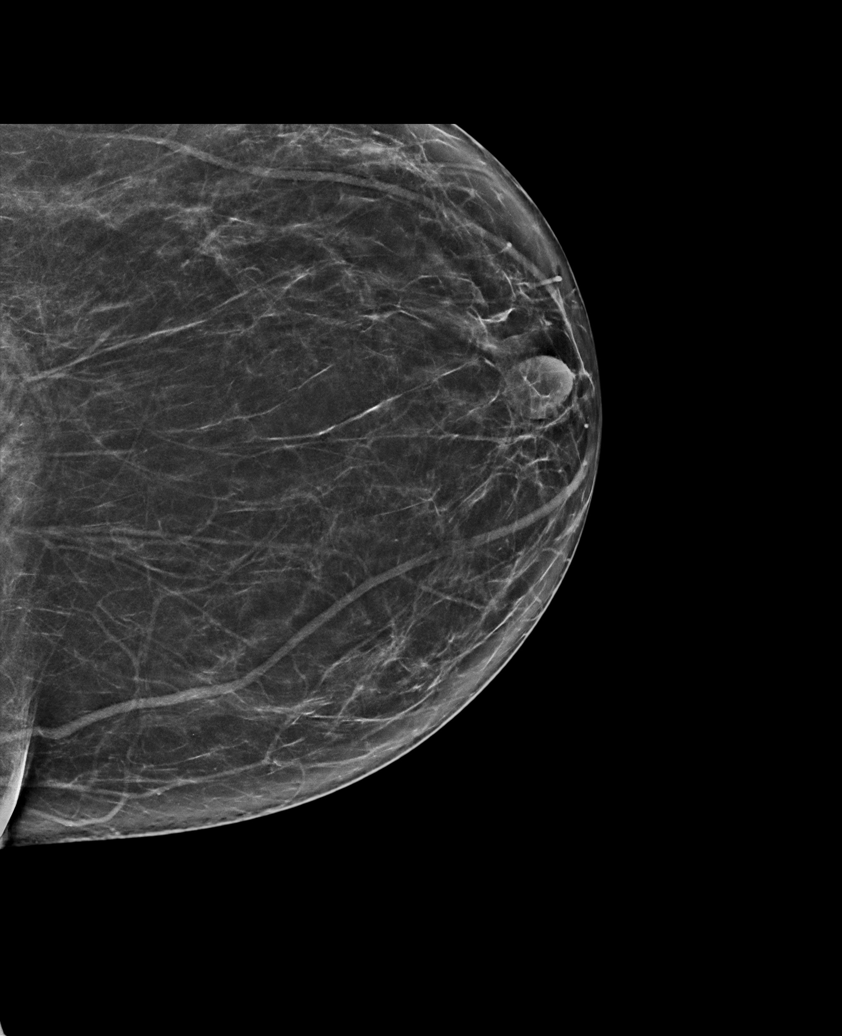

[L XCCL synth-2D]
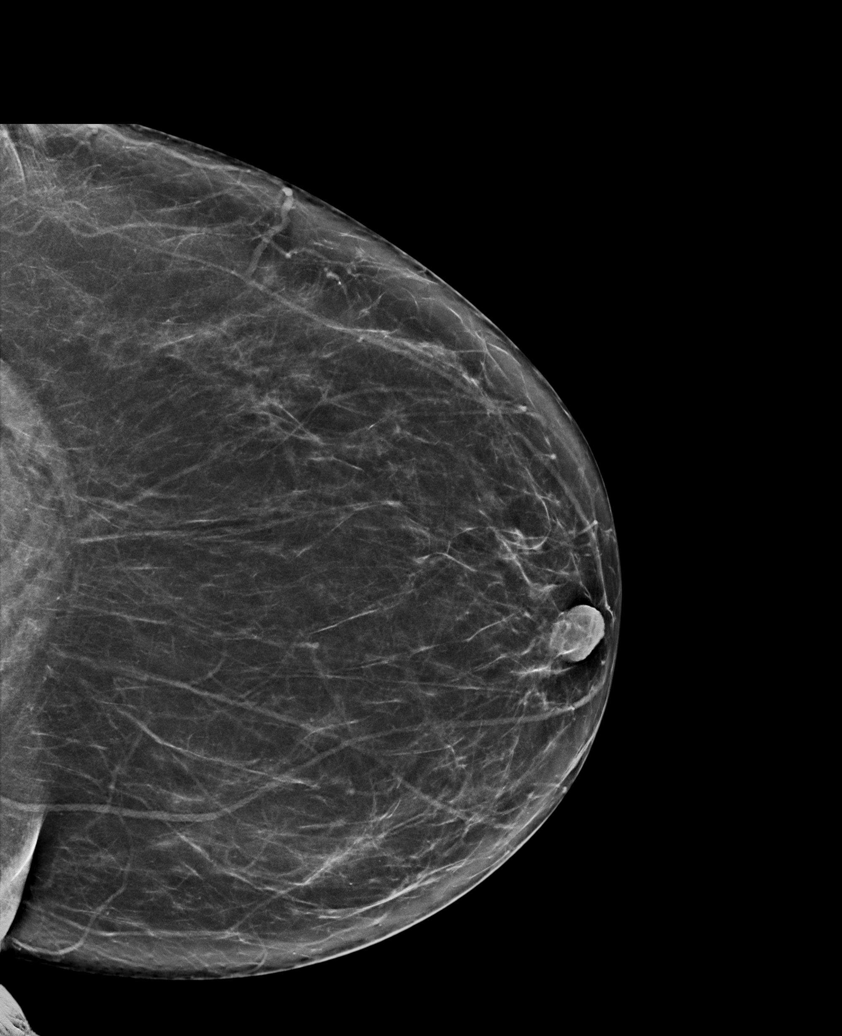

[R MLO synth-2D]
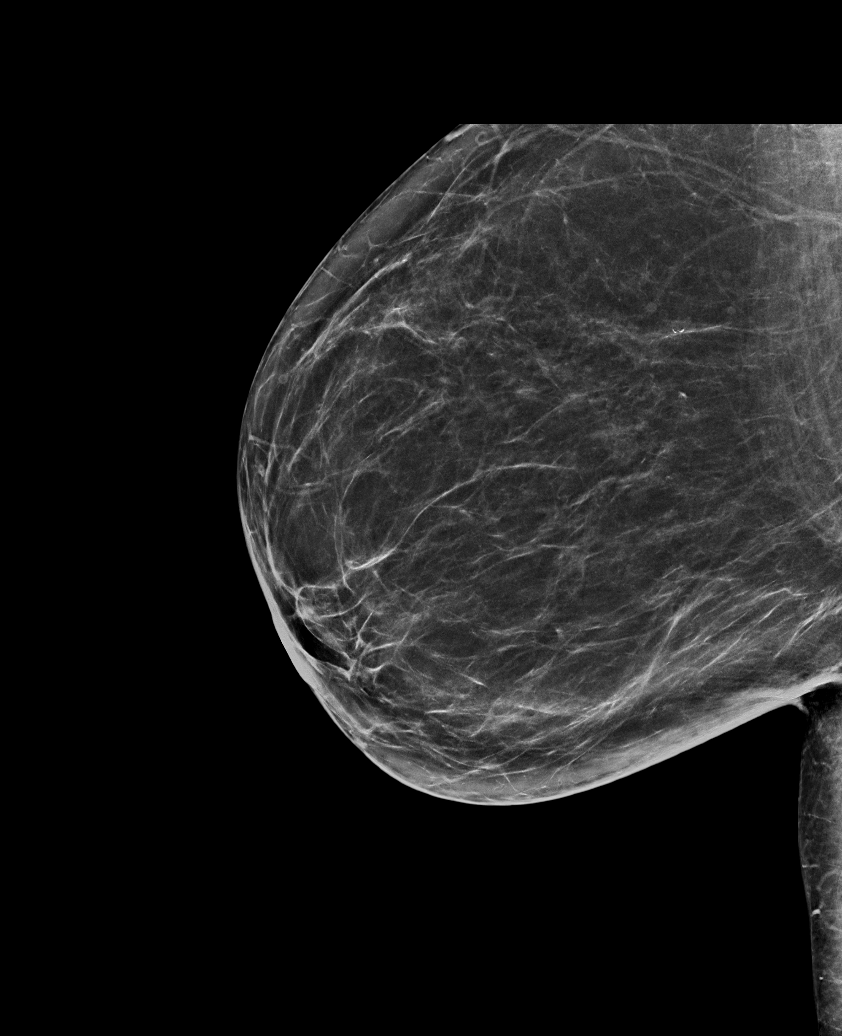

[6 of 36 positions shown; findings below may reference images not displayed]

ACR Breast Density Category b: There are scattered areas of
fibroglandular density.
FINDINGS: There are no findings suspicious for malignancy.
IMPRESSION: No mammographic evidence of malignancy. A result letter of this
screening mammogram will be mailed directly to the patient.

RECOMMENDATION:
Screening mammogram in one year. (Code:51-O-LD2)

BI-RADS CATEGORY  1: Negative.

## 2023-08-21 ENCOUNTER — Other Ambulatory Visit: Payer: Self-pay | Admitting: Medical

## 2023-08-21 MED ORDER — MOUNJARO 7.5 MG/0.5ML ~~LOC~~ SOAJ: 7.5000 mg | SUBCUTANEOUS | 1 refills | Status: DC

## 2023-08-21 MED ORDER — HYDROCHLOROTHIAZIDE 12.5 MG PO CAPS: 12.5000 mg | ORAL_CAPSULE | Freq: Every day | ORAL | 0 refills | Status: DC

## 2023-11-20 ENCOUNTER — Other Ambulatory Visit: Payer: Self-pay | Admitting: Medical

## 2023-12-26 ENCOUNTER — Other Ambulatory Visit: Payer: Self-pay | Admitting: Medical

## 2024-01-23 ENCOUNTER — Other Ambulatory Visit: Payer: Self-pay | Admitting: Medical

## 2024-02-18 ENCOUNTER — Other Ambulatory Visit: Payer: Self-pay | Admitting: Medical

## 2024-02-18 NOTE — Telephone Encounter (Signed)
 Pt overdue for appt. Mychart message sent to Pt (see below). No appt scheduled. Refills denied.   Me to Rita Hall     01/23/24 12:21 PM Good afternoon, Per our records, you are due for a follow-up with [name]. Please call the office to schedule an appointment at your earliest convenience.    Thank you, [facility]  Last read by Rita Hall at 11:18AM on 02/01/2024.

## 2024-03-16 ENCOUNTER — Other Ambulatory Visit: Payer: Self-pay | Admitting: Medical

## 2024-04-16 ENCOUNTER — Encounter: Payer: Self-pay | Admitting: *Deleted

## 2024-04-16 ENCOUNTER — Other Ambulatory Visit: Payer: Self-pay | Admitting: Medical

## 2024-05-20 ENCOUNTER — Other Ambulatory Visit: Payer: Self-pay | Admitting: Medical

## 2024-05-20 ENCOUNTER — Encounter (INDEPENDENT_AMBULATORY_CARE_PROVIDER_SITE_OTHER): Payer: Self-pay

## 2024-06-20 ENCOUNTER — Encounter: Admitting: Medical

## 2024-06-20 VITALS — BP 120/80 | HR 66 | Temp 98.4°F | Resp 14 | Ht 64.0 in | Wt 214.2 lb

## 2024-06-20 DIAGNOSIS — I10 Essential (primary) hypertension: Secondary | ICD-10-CM

## 2024-06-20 DIAGNOSIS — E669 Obesity, unspecified: Secondary | ICD-10-CM

## 2024-06-20 DIAGNOSIS — Z0000 Encounter for general adult medical examination without abnormal findings: Secondary | ICD-10-CM

## 2024-06-20 DIAGNOSIS — Z1231 Encounter for screening mammogram for malignant neoplasm of breast: Secondary | ICD-10-CM

## 2024-06-20 DIAGNOSIS — Z0184 Encounter for antibody response examination: Secondary | ICD-10-CM

## 2024-06-20 DIAGNOSIS — Z23 Encounter for immunization: Secondary | ICD-10-CM

## 2024-06-20 LAB — CBC WITH DIFFERENTIAL/PLATELET
Absolute Lymphocytes: 3005 {cells}/uL (ref 850–3900)
Absolute Monocytes: 410 {cells}/uL (ref 200–950)
Basophils Absolute: 38 {cells}/uL (ref 0–200)
Basophils Relative: 0.6 %
Eosinophils Absolute: 258 {cells}/uL (ref 15–500)
Eosinophils Relative: 4.1 %
HCT: 37.1 % (ref 35.9–46.0)
Hemoglobin: 12.1 g/dL (ref 11.7–15.5)
MCH: 28.6 pg (ref 27.0–33.0)
MCHC: 32.6 g/dL (ref 31.6–35.4)
MCV: 87.7 fL (ref 81.4–101.7)
MPV: 9.9 fL (ref 7.5–12.5)
Monocytes Relative: 6.5 %
Neutro Abs: 2589 {cells}/uL (ref 1500–7800)
Neutrophils Relative %: 41.1 %
Platelets: 312 10*3/uL (ref 140–400)
RBC: 4.23 Million/uL (ref 3.80–5.10)
RDW: 12.8 % (ref 11.0–15.0)
Total Lymphocyte: 47.7 %
WBC: 6.3 10*3/uL (ref 3.8–10.8)

## 2024-06-20 NOTE — Progress Notes (Signed)
 "  Subjective:    Patient ID: Rita Hall, female    DOB: 01-13-1966, 59 y.o.   MRN: [id number]  HPI  In for wellness exam. Pt is fasting.    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 Started working out at gym 3 days a week.  Nonsmoker. Alcohol glass of wine once every 6 month. Occasional coffee. Pt thinks eating healthy(avoiding fried foods as much as possible). But does skip breakfast.      Pt id due for mammogram.   Pt did see gynecologist- last pap 2024. 06/16/2022  Cytology - PAP( [facility])  HIGH RISK HPV ([facility]): Negative ADEQUACY: Satisfactory for evaluation; transformation zone component ABSENT. DIAGNOSIS: - Negative for intraepithelial lesion or malignancy (NILM) MICROORGANISMS - CYTOLOGY: Fungal organisms present consistent with Candida spp. COMMENT (MOLECULAR): Normal Reference Range HPV - Negative   Advised pt that gyn note stated to repeat  pap.  Your pap was normal and your HPV test was negative. Recommend repeat in 1 year.   Up to date on colonoscopy. Due next per epic in 2029.   Pt states got flu vaccine nov 2025.  She got shingrix  and pcv 20 vaccine.    Bp well controlled today. On hctz   Pt does note she is on phentermine. Dose is 37.5 mg daily.    Obesity. No family hx of thyroid cancer or personal hx of pancreatitis. Pt wasn  mounjaro  in the past. Had refill issues with pharmacy. Pt is now seeing weight loss management. The weight loss clinic recently gave her phentermine. Pt aware may increase bp.  Pt states moujaro did not help much with weight loss in the past.     Review of Systems  Constitutional:  Negative for chills, fatigue and fever.  HENT:  Negative for congestion.   Respiratory:  Negative for chest tightness, shortness of breath and wheezing.   Cardiovascular:  Negative for chest pain and palpitations.  Gastrointestinal:  Negative for abdominal pain, blood in stool, nausea and vomiting.  Genitourinary:  Negative for  dyspareunia, flank pain and hematuria.  Musculoskeletal:  Negative for back pain and myalgias.  Skin:  Negative for pallor and rash.  Neurological:  Negative for dizziness, speech difficulty, weakness and light-headedness.  Hematological:  Negative for adenopathy.  Psychiatric/Behavioral:  Negative for behavioral problems and dysphoric mood. The patient is not nervous/anxious.     Past Medical History:  Diagnosis Date   Obese      Social History   Socioeconomic History   Marital status: Divorced    Spouse name: Not on file   Number of children: Not on file   Years of education: Not on file   Highest education level: Master's degree (e.g., MA, MS, MEng, MEd, MSW, MBA)  Occupational History   Not on file  Tobacco Use   Smoking status: Never   Smokeless tobacco: Never  Vaping Use   Vaping status: Never Used  Substance and Sexual Activity   Alcohol use: Yes    Alcohol/week: 1.0 standard drink of alcohol    Types: 1 Glasses of wine per week    Comment: very rare.   Drug use: Never   Sexual activity: Not Currently    Birth control/protection: Surgical  Other Topics Concern   Not on file  Social History Narrative   Not on file   Social Drivers of Health   Tobacco Use: Low Risk (06/20/2024)   Patient History    Smoking Tobacco Use: Never    Smokeless  Tobacco Use: Never    Passive Exposure: Not on file  Financial Resource Strain: Medium Risk (06/20/2024)   Overall Financial Resource Strain (CARDIA)    Difficulty of Paying Living Expenses: Somewhat hard  Food Insecurity: No Food Insecurity (06/20/2024)   Epic    Worried About Programme Researcher, Broadcasting/film/video in the Last Year: Never true    Ran Out of Food in the Last Year: Never true  Transportation Needs: No Transportation Needs (06/20/2024)   Epic    Lack of Transportation (Medical): No    Lack of Transportation (Non-Medical): No  Physical Activity: Inactive (06/20/2024)   Exercise Vital Sign    Days of Exercise per Week: 0 days     Minutes of Exercise per Session: Not on file  Stress: Stress Concern Present (06/20/2024)   [employer] of Occupational Health - Occupational Stress Questionnaire    Feeling of Stress: Very much  Social Connections: Moderately Isolated (06/20/2024)   Social Connection and Isolation Panel    Frequency of Communication with Friends and Family: Three times a week    Frequency of Social Gatherings with Friends and Family: Once a week    Attends Religious Services: 1 to 4 times per year    Active Member of [employer] or Organizations: No    Attends Engineer, Structural: Not on file    Marital Status: Divorced  Intimate Partner Violence: Not on file  Depression (PHQ2-9): Medium Risk (06/20/2024)   Depression (PHQ2-9)    PHQ-2 Score: 8  Alcohol Screen: Low Risk (06/20/2024)   Alcohol Screen    Last Alcohol Screening Score (AUDIT): 1  Housing: Low Risk (06/20/2024)   Epic    Unable to Pay for Housing in the Last Year: No    Number of Times Moved in the Last Year: 0    Homeless in the Last Year: No  Utilities: Not on file  Health Literacy: Not on file    Past Surgical History:  Procedure Laterality Date   ABLATION N/A    Uterine   BUNIONECTOMY     CESAREAN SECTION     LIPOSUCTION N/A    TUBAL LIGATION      Family History  Problem Relation Age of Onset   Stroke Father    Hypertension Mother    Diabetes Mother    High Cholesterol Mother     Allergies[1]  Medications Ordered Prior to Encounter[2]  BP 120/80   Pulse 66   Temp 98.4 F (36.9 C) (Oral)   Resp 14   Ht 5' 4 (1.626 m)   Wt 214 lb 3.2 oz (97.2 kg)   LMP  (LMP Unknown)   SpO2 99%   BMI 36.77 kg/m        Objective:   Physical Exam  General Mental Status- Alert. General Appearance- Not in acute distress.   Skin General: Color- Normal Color. Moisture- Normal Moisture.  Neck Carotid Arteries- Normal color. Moisture- Normal Moisture. No carotid bruits. No JVD.  Chest and Lung  Exam Auscultation: Breath Sounds:-Normal.  Cardiovascular Auscultation:Rythm- Regular. Murmurs & Other Heart Sounds:Auscultation of the heart reveals- No Murmurs.  Abdomen Inspection:-Inspeection Normal. Palpation/Percussion:Note:No mass. Palpation and Percussion of the abdomen reveal- Non Tender, Non Distended + BS, no rebound or guarding.   Neurologic Cranial Nerve exam:- CN III-XII intact(No nystagmus), symmetric smile. Strength:- 5/5 equal and symmetric strength both upper and lower extremities.      Assessment & Plan:   Patient Instructions  For you wellness exam today I have  ordered cbc, hep b surface antibody, cmp and lipid panel.  Vaccine given today shingrix  and pcv 20. Can do Tetanus as nurse visit or thru pharmacy.  Schedule repeat pap as we discussed.  Up to date on colonoscopy.  Placed mammogram.  Recommend exercise and healthy diet.  We will let you know lab results as they come in.  Follow up date appointment will be determined after lab review.    Htn- bp well controlled on hydrochlorothiazide . Advise high potassium snacks to keep potasium level up.  Obesity- on phentermine thru weight loss clinic. Please send my chart message on dose you are on.      [name] Maxwell, PA-C    [id number] as did address hypertension and discuss her plan for weight loss to treat obesity. Counseled how we would approach other clinic using phentermine in context of her htn.    [1]  Allergies Allergen Reactions   Latex Dermatitis   Shellfish Allergy   [2]  Current Outpatient Medications on File Prior to Visit  Medication Sig Dispense Refill   hydrochlorothiazide  (MICROZIDE ) 12.5 MG capsule Take 1 capsule (12.5 mg total) by mouth daily. 30 capsule 1   tirzepatide  (MOUNJARO ) 7.5 MG/0.5ML Pen Inject 7.5 mg into the skin once a week. Needs appt 2 mL 0   No current facility-administered medications on file prior to visit.   "

## 2024-06-20 NOTE — Patient Instructions (Addendum)
 For you wellness exam today I have ordered cbc, hep b surface antibody, cmp and lipid panel.  Vaccine given today shingrix  and pcv 20. Can do Tetanus as nurse visit or thru pharmacy.  Schedule repeat pap as we discussed.  Up to date on colonoscopy.  Placed mammogram.  Recommend exercise and healthy diet.  We will let you know lab results as they come in.  Follow up date appointment will be determined after lab review.    Htn- bp well controlled on hydrochlorothiazide . Advise high potassium snacks to keep potasium level up.  Obesity- on phentermine thru weight loss clinic. Please send my chart message on dose you are on.   Preventive Care 58-77 Years Old, Female Preventive care refers to lifestyle choices and visits with your health care provider that can promote health and wellness. Preventive care visits are also called wellness exams. What can I expect for my preventive care visit? Counseling Your health care provider may ask you questions about your: Medical history, including: Past medical problems. Family medical history. Pregnancy history. Current health, including: Menstrual cycle. Method of birth control. Emotional well-being. Home life and relationship well-being. Sexual activity and sexual health. Lifestyle, including: Alcohol, nicotine or tobacco, and drug use. Access to firearms. Diet, exercise, and sleep habits. Work and work astronomer. Sunscreen use. Safety issues such as seatbelt and bike helmet use. Physical exam Your health care provider will check your: Height and weight. These may be used to calculate your BMI (body mass index). BMI is a measurement that tells if you are at a healthy weight. Waist circumference. This measures the distance around your waistline. This measurement also tells if you are at a healthy weight and may help predict your risk of certain diseases, such as type 2 diabetes and high blood pressure. Heart rate and blood  pressure. Body temperature. Skin for abnormal spots. What immunizations do I need?  Vaccines are usually given at various ages, according to a schedule. Your health care provider will recommend vaccines for you based on your age, medical history, and lifestyle or other factors, such as travel or where you work. What tests do I need? Screening Your health care provider may recommend screening tests for certain conditions. This may include: Lipid and cholesterol levels. Diabetes screening. This is done by checking your blood sugar (glucose) after you have not eaten for a while (fasting). Pelvic exam and Pap test. Hepatitis B test. Hepatitis C test. HIV (human immunodeficiency virus) test. STI (sexually transmitted infection) testing, if you are at risk. Lung cancer screening. Colorectal cancer screening. Mammogram. Talk with your health care provider about when you should start having regular mammograms. This may depend on whether you have a family history of breast cancer. BRCA-related cancer screening. This may be done if you have a family history of breast, ovarian, tubal, or peritoneal cancers. Bone density scan. This is done to screen for osteoporosis. Talk with your health care provider about your test results, treatment options, and if necessary, the need for more tests. Follow these instructions at home: Eating and drinking  Eat a diet that includes fresh fruits and vegetables, whole grains, lean protein, and low-fat dairy products. Take vitamin and mineral supplements as recommended by your health care provider. Do not drink alcohol if: Your health care provider tells you not to drink. You are pregnant, may be pregnant, or are planning to become pregnant. If you drink alcohol: Limit how much you have to 0-1 drink a day. Know how much alcohol  is in your drink. In the [place] one drink equals one 12 oz bottle of beer (355 mL), one 5 oz glass of wine (148 mL), or one 1 oz glass of  hard liquor (44 mL). Lifestyle Brush your teeth every morning and night with fluoride toothpaste. Floss one time each day. Exercise for at least 30 minutes 5 or more days each week. Do not use any products that contain nicotine or tobacco. These products include cigarettes, chewing tobacco, and vaping devices, such as e-cigarettes. If you need help quitting, ask your health care provider. Do not use drugs. If you are sexually active, practice safe sex. Use a condom or other form of protection to prevent STIs. If you do not wish to become pregnant, use a form of birth control. If you plan to become pregnant, see your health care provider for a prepregnancy visit. Take aspirin only as told by your health care provider. Make sure that you understand how much to take and what form to take. Work with your health care provider to find out whether it is safe and beneficial for you to take aspirin daily. Find healthy ways to manage stress, such as: Meditation, yoga, or listening to music. Journaling. Talking to a trusted person. Spending time with friends and family. Minimize exposure to UV radiation to reduce your risk of skin cancer. Safety Always wear your seat belt while driving or riding in a vehicle. Do not drive: If you have been drinking alcohol. Do not ride with someone who has been drinking. When you are tired or distracted. While texting. If you have been using any mind-altering substances or drugs. Wear a helmet and other protective equipment during sports activities. If you have firearms in your house, make sure you follow all gun safety procedures. Seek help if you have been physically or sexually abused. What's next? Visit your health care provider once a year for an annual wellness visit. Ask your health care provider how often you should have your eyes and teeth checked. Stay up to date on all vaccines. This information is not intended to replace advice given to you by your  health care provider. Make sure you discuss any questions you have with your health care provider. Document Revised: 10/27/2020 Document Reviewed: 10/27/2020 Elsevier Patient Education  2024 [employer].
